# Patient Record
Sex: Female | Born: 1960 | Race: White | Hispanic: No | Marital: Married | Smoking: Current every day smoker
Health system: Southern US, Community
[De-identification: ages and names within clinical notes are randomized; demographics above are authoritative.]

## PROBLEM LIST (undated history)

## (undated) DIAGNOSIS — M549 Dorsalgia, unspecified: Secondary | ICD-10-CM

## (undated) DIAGNOSIS — F32A Depression, unspecified: Secondary | ICD-10-CM

## (undated) DIAGNOSIS — R32 Unspecified urinary incontinence: Secondary | ICD-10-CM

## (undated) DIAGNOSIS — G43909 Migraine, unspecified, not intractable, without status migrainosus: Secondary | ICD-10-CM

## (undated) DIAGNOSIS — F329 Major depressive disorder, single episode, unspecified: Secondary | ICD-10-CM

## (undated) DIAGNOSIS — F419 Anxiety disorder, unspecified: Secondary | ICD-10-CM

## (undated) HISTORY — DX: Unspecified urinary incontinence: R32

## (undated) HISTORY — PX: BACK SURGERY: SHX140

## (undated) HISTORY — DX: Dorsalgia, unspecified: M54.9

## (undated) HISTORY — DX: Anxiety disorder, unspecified: F41.9

## (undated) HISTORY — DX: Migraine, unspecified, not intractable, without status migrainosus: G43.909

## (undated) HISTORY — DX: Major depressive disorder, single episode, unspecified: F32.9

## (undated) HISTORY — DX: Depression, unspecified: F32.A

---

## 2003-01-21 ENCOUNTER — Ambulatory Visit (HOSPITAL_COMMUNITY): Admission: RE | Admit: 2003-01-21 | Discharge: 2003-01-21 | Payer: Self-pay | Admitting: Internal Medicine

## 2009-07-10 ENCOUNTER — Ambulatory Visit (HOSPITAL_COMMUNITY): Admission: RE | Admit: 2009-07-10 | Discharge: 2009-07-10 | Payer: Self-pay | Admitting: Neurological Surgery

## 2010-07-13 LAB — CBC
Platelets: 294 10*3/uL (ref 150–400)
RBC: 3.95 MIL/uL (ref 3.87–5.11)
WBC: 6.9 10*3/uL (ref 4.0–10.5)

## 2010-07-13 LAB — SURGICAL PCR SCREEN: MRSA, PCR: NEGATIVE

## 2010-09-04 NOTE — Op Note (Signed)
[name], Kristy Pearson                         ACCOUNT NO.:  [id number]   MEDICAL RECORD NO.:  [id number]                   PATIENT TYPE:  AMB   LOCATION:  DAY                                  FACILITY:  [facility]   PHYSICIAN:  Lionel December, M.D.                 DATE OF BIRTH:  21-Aug-1960   DATE OF PROCEDURE:  01/21/2003  DATE OF DISCHARGE:                                 OPERATIVE REPORT   PROCEDURE:  Total colonoscopy.   ENDOSCOPIST:  Lionel December, M.D.   INDICATIONS:  [name] is a 50 year old Caucasian female who recently had an  episode of rectal bleeding when she passed a moderate amount of blood.  She  has had a few more episodes of hematochezia and she also has intermittent,  left upper quadrant pain.  She is undergoing diagnostic colonoscopy.  The procedure and risks were reviewed with the patient and informed consent  was obtained.   PREOPERATIVE MEDICATIONS:  Demerol 25 mg IV and Versed 10 mg IV in divided  dose.   FINDINGS:  The procedure performed in endoscopy suite.  The patient's vital  signs and O2 saturation were monitored during the procedure and remained  stable.  The patient was placed in the left lateral recumbent position and  rectal examination was performed.  No abnormality noted on external or  digital exam.   Olympus videoscope was placed in the rectum and advanced under vision into  the sigmoid colon.  Preparation was satisfactory.  There was a tiny polyp at  the midsigmoid colon which was ablated by a cold biopsy.  The scope was  passed into the cecum which was identified by appendiceal orifice and  ileocecal valve.  Pictures were taken for the record.  A short segment of TL  was also examined and was normal.  The endoscope was gradually withdrawn and  the colonic mucosa was once again carefully examined.  There were no other  mucosal abnormalities.  The rectal mucosa was normal.   The scope was retroflexed to examine the anorectal junction; and  moderate  sized hemorrhoids were noted below the dentate line. The endoscope was  straightened and withdrawn.  The patient tolerated the procedure well.   FINAL DIAGNOSES:  1. Small polyp ablated by a cold biopsy from the sigmoid colon.  2. Moderate-sized external hemorrhoids felt to be the source of her rectal     bleeding.  3. Colonoscopy and terminal ileoscopy was otherwise normal.   RECOMMENDATIONS:  1. TSH will be checked today.  2.     Citrucel 1 tablespoonful daily along with high fiber diet and Levsin SL     t.i.d. p.r.n. for left-sided abdominal pain.  3. She will go ahead and use ProctoFoam HC per rectum b.i.d. for 10-14 days.  4. I will contact the patient with results of biopsy and blood tests.      ___________________________________________  Lionel December, M.D.   NR/MEDQ  D:  01/21/2003  T:  01/21/2003  Job:  [id number]   cc:   Fara Chute  [address] [place]  [place] [address]  Fax: [phone]

## 2011-04-26 DIAGNOSIS — F411 Generalized anxiety disorder: Secondary | ICD-10-CM | POA: Diagnosis not present

## 2011-04-26 DIAGNOSIS — IMO0002 Reserved for concepts with insufficient information to code with codable children: Secondary | ICD-10-CM | POA: Diagnosis not present

## 2011-04-26 DIAGNOSIS — F17200 Nicotine dependence, unspecified, uncomplicated: Secondary | ICD-10-CM | POA: Diagnosis not present

## 2011-04-26 DIAGNOSIS — G5600 Carpal tunnel syndrome, unspecified upper limb: Secondary | ICD-10-CM | POA: Diagnosis not present

## 2011-04-26 DIAGNOSIS — M545 Low back pain: Secondary | ICD-10-CM | POA: Diagnosis not present

## 2011-07-12 DIAGNOSIS — M545 Low back pain: Secondary | ICD-10-CM | POA: Diagnosis not present

## 2011-07-12 DIAGNOSIS — F17200 Nicotine dependence, unspecified, uncomplicated: Secondary | ICD-10-CM | POA: Diagnosis not present

## 2011-07-12 DIAGNOSIS — G5600 Carpal tunnel syndrome, unspecified upper limb: Secondary | ICD-10-CM | POA: Diagnosis not present

## 2011-07-12 DIAGNOSIS — IMO0002 Reserved for concepts with insufficient information to code with codable children: Secondary | ICD-10-CM | POA: Diagnosis not present

## 2011-07-12 DIAGNOSIS — J309 Allergic rhinitis, unspecified: Secondary | ICD-10-CM | POA: Diagnosis not present

## 2011-07-12 DIAGNOSIS — F411 Generalized anxiety disorder: Secondary | ICD-10-CM | POA: Diagnosis not present

## 2011-10-23 DIAGNOSIS — R110 Nausea: Secondary | ICD-10-CM | POA: Diagnosis not present

## 2011-10-23 DIAGNOSIS — N859 Noninflammatory disorder of uterus, unspecified: Secondary | ICD-10-CM | POA: Diagnosis not present

## 2011-10-23 DIAGNOSIS — K2900 Acute gastritis without bleeding: Secondary | ICD-10-CM | POA: Diagnosis not present

## 2011-10-23 DIAGNOSIS — R1110 Vomiting, unspecified: Secondary | ICD-10-CM | POA: Diagnosis not present

## 2011-10-23 DIAGNOSIS — R109 Unspecified abdominal pain: Secondary | ICD-10-CM | POA: Diagnosis not present

## 2011-10-23 DIAGNOSIS — K2990 Gastroduodenitis, unspecified, without bleeding: Secondary | ICD-10-CM | POA: Diagnosis not present

## 2011-10-23 DIAGNOSIS — N200 Calculus of kidney: Secondary | ICD-10-CM | POA: Diagnosis not present

## 2011-10-23 DIAGNOSIS — F17200 Nicotine dependence, unspecified, uncomplicated: Secondary | ICD-10-CM | POA: Diagnosis not present

## 2011-10-23 DIAGNOSIS — Z79899 Other long term (current) drug therapy: Secondary | ICD-10-CM | POA: Diagnosis not present

## 2011-10-23 DIAGNOSIS — K2970 Gastritis, unspecified, without bleeding: Secondary | ICD-10-CM | POA: Diagnosis not present

## 2011-10-23 DIAGNOSIS — R112 Nausea with vomiting, unspecified: Secondary | ICD-10-CM | POA: Diagnosis not present

## 2011-10-26 DIAGNOSIS — R112 Nausea with vomiting, unspecified: Secondary | ICD-10-CM | POA: Diagnosis not present

## 2011-10-29 DIAGNOSIS — R1011 Right upper quadrant pain: Secondary | ICD-10-CM | POA: Diagnosis not present

## 2011-10-29 DIAGNOSIS — R109 Unspecified abdominal pain: Secondary | ICD-10-CM | POA: Diagnosis not present

## 2011-11-11 DIAGNOSIS — Z124 Encounter for screening for malignant neoplasm of cervix: Secondary | ICD-10-CM | POA: Diagnosis not present

## 2011-11-11 DIAGNOSIS — K625 Hemorrhage of anus and rectum: Secondary | ICD-10-CM | POA: Diagnosis not present

## 2011-11-11 DIAGNOSIS — F17200 Nicotine dependence, unspecified, uncomplicated: Secondary | ICD-10-CM | POA: Diagnosis not present

## 2011-11-11 DIAGNOSIS — J309 Allergic rhinitis, unspecified: Secondary | ICD-10-CM | POA: Diagnosis not present

## 2011-11-11 DIAGNOSIS — R112 Nausea with vomiting, unspecified: Secondary | ICD-10-CM | POA: Diagnosis not present

## 2011-11-11 DIAGNOSIS — Z01419 Encounter for gynecological examination (general) (routine) without abnormal findings: Secondary | ICD-10-CM | POA: Diagnosis not present

## 2011-11-11 DIAGNOSIS — Z0000 Encounter for general adult medical examination without abnormal findings: Secondary | ICD-10-CM | POA: Diagnosis not present

## 2011-11-11 DIAGNOSIS — M545 Low back pain: Secondary | ICD-10-CM | POA: Diagnosis not present

## 2011-11-11 DIAGNOSIS — F411 Generalized anxiety disorder: Secondary | ICD-10-CM | POA: Diagnosis not present

## 2011-12-17 IMAGING — RF DG LUMBAR SPINE 2-3V
1 series · 2 of 2 positions shown · non-contrast
Comparison: None.

CLINICAL DATA: Lumbar radiculopathy and disc herniation.

LUMBAR SPINE - 2-3 VIEW

[Series 1: run · 2 of 2 slices shown]
[im 1/2]
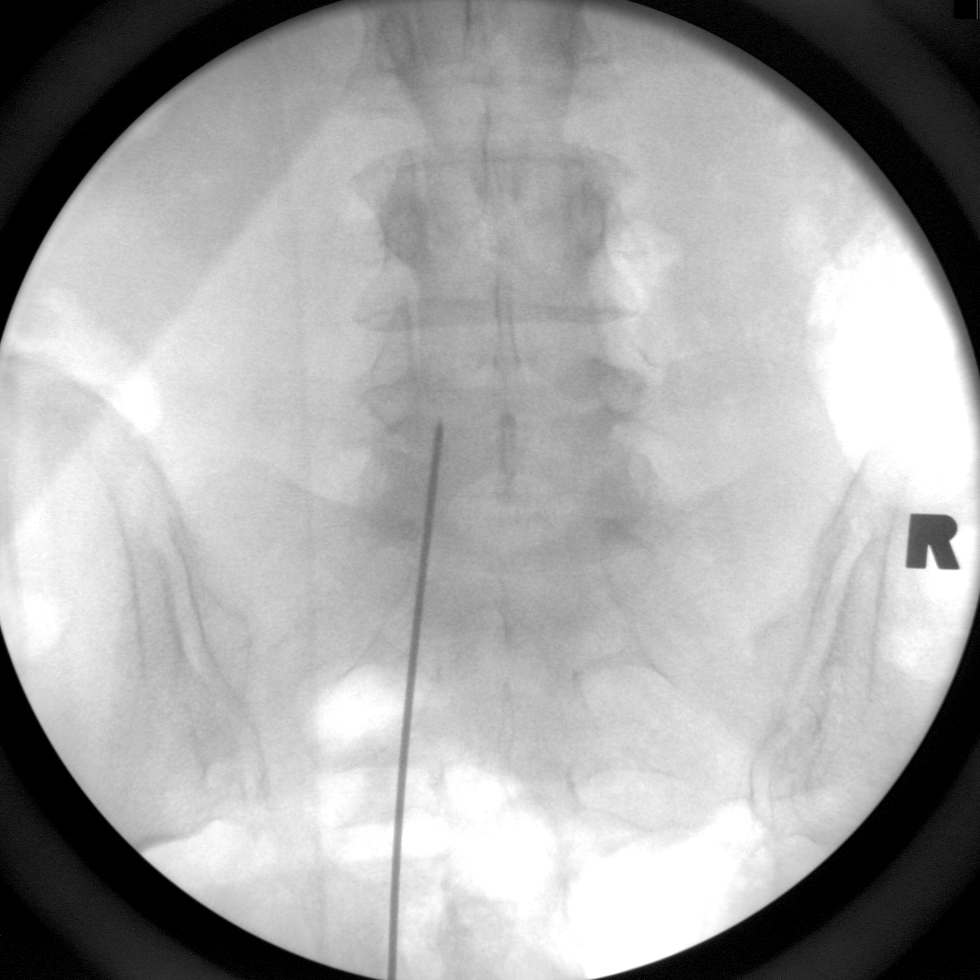
[im 2/2]
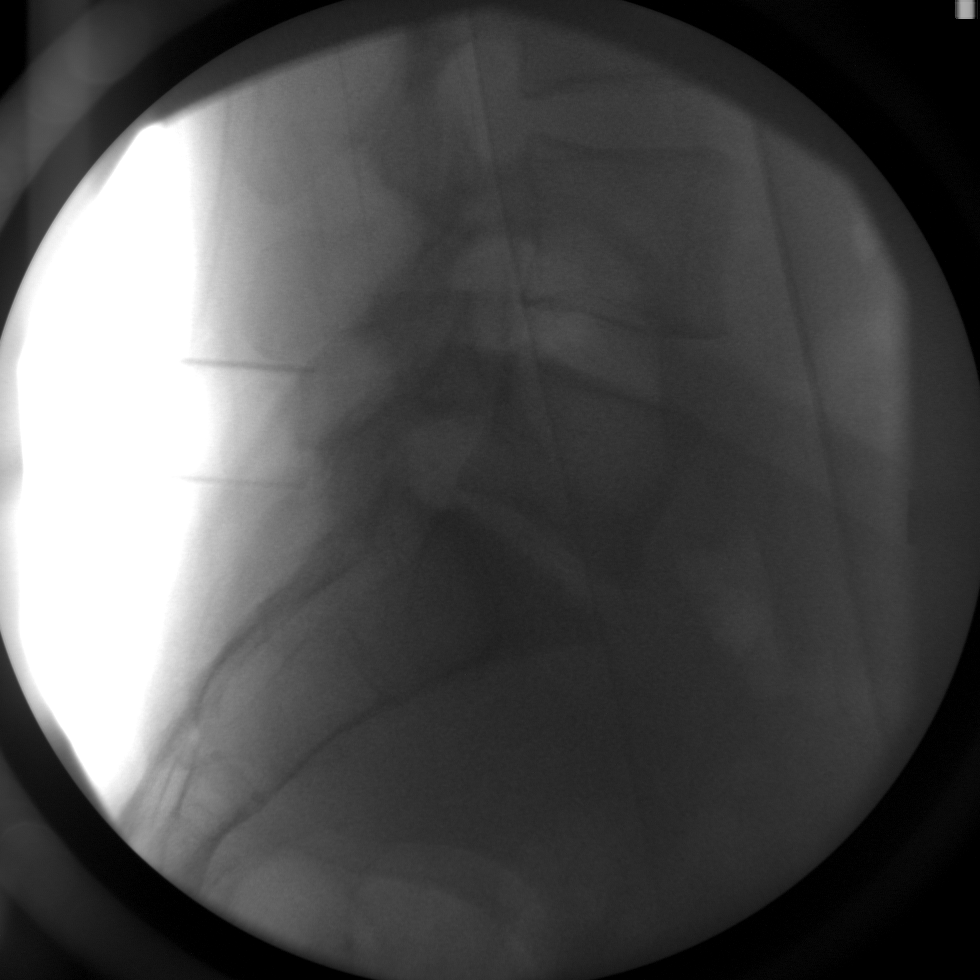

[2 of 2 positions shown; findings below may reference images not displayed]

FINDINGS: Initial intraoperative PA view shows a needle overlying
the lower lumbar spine at the level of the L5 vertebra.

The cross-table lateral views and swelling obtained shows
instruments posteriorly within the interspinous spaces of L4-5 and
L5-S1.
IMPRESSION: Intraoperative localization of interspinous spaces at L4-5 and L5-
S1.

## 2012-02-02 DIAGNOSIS — F411 Generalized anxiety disorder: Secondary | ICD-10-CM | POA: Diagnosis not present

## 2012-02-02 DIAGNOSIS — IMO0002 Reserved for concepts with insufficient information to code with codable children: Secondary | ICD-10-CM | POA: Diagnosis not present

## 2012-02-02 DIAGNOSIS — F17200 Nicotine dependence, unspecified, uncomplicated: Secondary | ICD-10-CM | POA: Diagnosis not present

## 2012-02-02 DIAGNOSIS — M545 Low back pain: Secondary | ICD-10-CM | POA: Diagnosis not present

## 2012-02-02 DIAGNOSIS — Z23 Encounter for immunization: Secondary | ICD-10-CM | POA: Diagnosis not present

## 2012-06-05 DIAGNOSIS — F411 Generalized anxiety disorder: Secondary | ICD-10-CM | POA: Diagnosis not present

## 2012-06-05 DIAGNOSIS — M545 Low back pain: Secondary | ICD-10-CM | POA: Diagnosis not present

## 2012-06-05 DIAGNOSIS — J069 Acute upper respiratory infection, unspecified: Secondary | ICD-10-CM | POA: Diagnosis not present

## 2012-06-05 DIAGNOSIS — F17200 Nicotine dependence, unspecified, uncomplicated: Secondary | ICD-10-CM | POA: Diagnosis not present

## 2012-06-05 DIAGNOSIS — R51 Headache: Secondary | ICD-10-CM | POA: Diagnosis not present

## 2012-10-10 DIAGNOSIS — F17200 Nicotine dependence, unspecified, uncomplicated: Secondary | ICD-10-CM | POA: Diagnosis not present

## 2012-10-10 DIAGNOSIS — R51 Headache: Secondary | ICD-10-CM | POA: Diagnosis not present

## 2012-10-10 DIAGNOSIS — F411 Generalized anxiety disorder: Secondary | ICD-10-CM | POA: Diagnosis not present

## 2012-10-10 DIAGNOSIS — K3189 Other diseases of stomach and duodenum: Secondary | ICD-10-CM | POA: Diagnosis not present

## 2012-10-10 DIAGNOSIS — R1013 Epigastric pain: Secondary | ICD-10-CM | POA: Diagnosis not present

## 2012-10-10 DIAGNOSIS — M545 Low back pain: Secondary | ICD-10-CM | POA: Diagnosis not present

## 2013-02-12 DIAGNOSIS — R51 Headache: Secondary | ICD-10-CM | POA: Diagnosis not present

## 2013-02-12 DIAGNOSIS — F17200 Nicotine dependence, unspecified, uncomplicated: Secondary | ICD-10-CM | POA: Diagnosis not present

## 2013-02-12 DIAGNOSIS — Z23 Encounter for immunization: Secondary | ICD-10-CM | POA: Diagnosis not present

## 2013-02-12 DIAGNOSIS — G619 Inflammatory polyneuropathy, unspecified: Secondary | ICD-10-CM | POA: Diagnosis not present

## 2013-02-12 DIAGNOSIS — F411 Generalized anxiety disorder: Secondary | ICD-10-CM | POA: Diagnosis not present

## 2013-02-12 DIAGNOSIS — M545 Low back pain: Secondary | ICD-10-CM | POA: Diagnosis not present

## 2013-05-15 DIAGNOSIS — F411 Generalized anxiety disorder: Secondary | ICD-10-CM | POA: Diagnosis not present

## 2013-05-15 DIAGNOSIS — M545 Low back pain, unspecified: Secondary | ICD-10-CM | POA: Diagnosis not present

## 2013-05-15 DIAGNOSIS — K2100 Gastro-esophageal reflux disease with esophagitis, without bleeding: Secondary | ICD-10-CM | POA: Diagnosis not present

## 2013-05-15 DIAGNOSIS — F17200 Nicotine dependence, unspecified, uncomplicated: Secondary | ICD-10-CM | POA: Diagnosis not present

## 2013-05-15 DIAGNOSIS — R319 Hematuria, unspecified: Secondary | ICD-10-CM | POA: Diagnosis not present

## 2013-05-15 DIAGNOSIS — R51 Headache: Secondary | ICD-10-CM | POA: Diagnosis not present

## 2013-05-17 DIAGNOSIS — R310 Gross hematuria: Secondary | ICD-10-CM | POA: Diagnosis not present

## 2013-05-17 DIAGNOSIS — N200 Calculus of kidney: Secondary | ICD-10-CM | POA: Diagnosis not present

## 2013-09-14 DIAGNOSIS — G622 Polyneuropathy due to other toxic agents: Secondary | ICD-10-CM | POA: Diagnosis not present

## 2013-09-14 DIAGNOSIS — K2100 Gastro-esophageal reflux disease with esophagitis, without bleeding: Secondary | ICD-10-CM | POA: Diagnosis not present

## 2013-09-14 DIAGNOSIS — R51 Headache: Secondary | ICD-10-CM | POA: Diagnosis not present

## 2013-09-14 DIAGNOSIS — F411 Generalized anxiety disorder: Secondary | ICD-10-CM | POA: Diagnosis not present

## 2013-09-14 DIAGNOSIS — G619 Inflammatory polyneuropathy, unspecified: Secondary | ICD-10-CM | POA: Diagnosis not present

## 2013-09-14 DIAGNOSIS — F17200 Nicotine dependence, unspecified, uncomplicated: Secondary | ICD-10-CM | POA: Diagnosis not present

## 2013-09-14 DIAGNOSIS — M545 Low back pain, unspecified: Secondary | ICD-10-CM | POA: Diagnosis not present

## 2013-11-06 DIAGNOSIS — R310 Gross hematuria: Secondary | ICD-10-CM | POA: Diagnosis not present

## 2013-11-06 DIAGNOSIS — F17200 Nicotine dependence, unspecified, uncomplicated: Secondary | ICD-10-CM | POA: Diagnosis not present

## 2013-11-06 DIAGNOSIS — M545 Low back pain, unspecified: Secondary | ICD-10-CM | POA: Diagnosis not present

## 2013-11-06 DIAGNOSIS — G619 Inflammatory polyneuropathy, unspecified: Secondary | ICD-10-CM | POA: Diagnosis not present

## 2013-11-06 DIAGNOSIS — M5430 Sciatica, unspecified side: Secondary | ICD-10-CM | POA: Diagnosis not present

## 2013-11-06 DIAGNOSIS — N200 Calculus of kidney: Secondary | ICD-10-CM | POA: Diagnosis not present

## 2013-11-09 DIAGNOSIS — M47817 Spondylosis without myelopathy or radiculopathy, lumbosacral region: Secondary | ICD-10-CM | POA: Diagnosis not present

## 2013-11-09 DIAGNOSIS — M5137 Other intervertebral disc degeneration, lumbosacral region: Secondary | ICD-10-CM | POA: Diagnosis not present

## 2013-11-09 DIAGNOSIS — N200 Calculus of kidney: Secondary | ICD-10-CM | POA: Diagnosis not present

## 2014-01-15 DIAGNOSIS — M545 Low back pain, unspecified: Secondary | ICD-10-CM | POA: Diagnosis not present

## 2014-01-15 DIAGNOSIS — G622 Polyneuropathy due to other toxic agents: Secondary | ICD-10-CM | POA: Diagnosis not present

## 2014-01-15 DIAGNOSIS — F411 Generalized anxiety disorder: Secondary | ICD-10-CM | POA: Diagnosis not present

## 2014-01-15 DIAGNOSIS — R51 Headache: Secondary | ICD-10-CM | POA: Diagnosis not present

## 2014-01-15 DIAGNOSIS — Z23 Encounter for immunization: Secondary | ICD-10-CM | POA: Diagnosis not present

## 2014-01-15 DIAGNOSIS — F17200 Nicotine dependence, unspecified, uncomplicated: Secondary | ICD-10-CM | POA: Diagnosis not present

## 2014-01-15 DIAGNOSIS — G619 Inflammatory polyneuropathy, unspecified: Secondary | ICD-10-CM | POA: Diagnosis not present

## 2014-01-15 DIAGNOSIS — K2100 Gastro-esophageal reflux disease with esophagitis, without bleeding: Secondary | ICD-10-CM | POA: Diagnosis not present

## 2014-08-09 DIAGNOSIS — Z1389 Encounter for screening for other disorder: Secondary | ICD-10-CM | POA: Diagnosis not present

## 2014-08-09 DIAGNOSIS — M545 Low back pain: Secondary | ICD-10-CM | POA: Diagnosis not present

## 2014-08-09 DIAGNOSIS — K210 Gastro-esophageal reflux disease with esophagitis: Secondary | ICD-10-CM | POA: Diagnosis not present

## 2014-08-09 DIAGNOSIS — R51 Headache: Secondary | ICD-10-CM | POA: Diagnosis not present

## 2014-08-09 DIAGNOSIS — F0280 Dementia in other diseases classified elsewhere without behavioral disturbance: Secondary | ICD-10-CM | POA: Diagnosis not present

## 2014-08-09 DIAGNOSIS — G619 Inflammatory polyneuropathy, unspecified: Secondary | ICD-10-CM | POA: Diagnosis not present

## 2014-08-09 DIAGNOSIS — F324 Major depressive disorder, single episode, in partial remission: Secondary | ICD-10-CM | POA: Diagnosis not present

## 2014-08-09 DIAGNOSIS — Z720 Tobacco use: Secondary | ICD-10-CM | POA: Diagnosis not present

## 2014-08-09 DIAGNOSIS — F411 Generalized anxiety disorder: Secondary | ICD-10-CM | POA: Diagnosis not present

## 2014-12-09 DIAGNOSIS — K210 Gastro-esophageal reflux disease with esophagitis: Secondary | ICD-10-CM | POA: Diagnosis not present

## 2014-12-09 DIAGNOSIS — F411 Generalized anxiety disorder: Secondary | ICD-10-CM | POA: Diagnosis not present

## 2014-12-09 DIAGNOSIS — R51 Headache: Secondary | ICD-10-CM | POA: Diagnosis not present

## 2014-12-09 DIAGNOSIS — F324 Major depressive disorder, single episode, in partial remission: Secondary | ICD-10-CM | POA: Diagnosis not present

## 2014-12-09 DIAGNOSIS — M545 Low back pain: Secondary | ICD-10-CM | POA: Diagnosis not present

## 2014-12-09 DIAGNOSIS — F0280 Dementia in other diseases classified elsewhere without behavioral disturbance: Secondary | ICD-10-CM | POA: Diagnosis not present

## 2014-12-09 DIAGNOSIS — G619 Inflammatory polyneuropathy, unspecified: Secondary | ICD-10-CM | POA: Diagnosis not present

## 2014-12-09 DIAGNOSIS — Z720 Tobacco use: Secondary | ICD-10-CM | POA: Diagnosis not present

## 2014-12-17 ENCOUNTER — Ambulatory Visit: Payer: Medicare Other | Admitting: Neurology

## 2014-12-17 ENCOUNTER — Telehealth: Payer: Self-pay | Admitting: *Deleted

## 2014-12-17 NOTE — Telephone Encounter (Signed)
no showed new patient appt

## 2014-12-18 ENCOUNTER — Encounter: Payer: Self-pay | Admitting: Neurology

## 2015-01-06 ENCOUNTER — Ambulatory Visit: Payer: Medicare Other | Admitting: Neurology

## 2015-03-04 DIAGNOSIS — M545 Low back pain: Secondary | ICD-10-CM | POA: Diagnosis not present

## 2015-03-04 DIAGNOSIS — R51 Headache: Secondary | ICD-10-CM | POA: Diagnosis not present

## 2015-03-04 DIAGNOSIS — F411 Generalized anxiety disorder: Secondary | ICD-10-CM | POA: Diagnosis not present

## 2015-03-04 DIAGNOSIS — K210 Gastro-esophageal reflux disease with esophagitis: Secondary | ICD-10-CM | POA: Diagnosis not present

## 2015-03-04 DIAGNOSIS — G619 Inflammatory polyneuropathy, unspecified: Secondary | ICD-10-CM | POA: Diagnosis not present

## 2015-03-04 DIAGNOSIS — Z720 Tobacco use: Secondary | ICD-10-CM | POA: Diagnosis not present

## 2015-03-04 DIAGNOSIS — F0280 Dementia in other diseases classified elsewhere without behavioral disturbance: Secondary | ICD-10-CM | POA: Diagnosis not present

## 2015-03-04 DIAGNOSIS — F324 Major depressive disorder, single episode, in partial remission: Secondary | ICD-10-CM | POA: Diagnosis not present

## 2015-03-04 DIAGNOSIS — Z23 Encounter for immunization: Secondary | ICD-10-CM | POA: Diagnosis not present

## 2015-09-02 DIAGNOSIS — R1013 Epigastric pain: Secondary | ICD-10-CM | POA: Diagnosis not present

## 2015-09-02 DIAGNOSIS — N390 Urinary tract infection, site not specified: Secondary | ICD-10-CM | POA: Diagnosis not present

## 2015-09-02 DIAGNOSIS — F329 Major depressive disorder, single episode, unspecified: Secondary | ICD-10-CM | POA: Diagnosis not present

## 2015-09-02 DIAGNOSIS — R1110 Vomiting, unspecified: Secondary | ICD-10-CM | POA: Diagnosis not present

## 2015-09-02 DIAGNOSIS — Z87442 Personal history of urinary calculi: Secondary | ICD-10-CM | POA: Diagnosis not present

## 2015-09-02 DIAGNOSIS — F17200 Nicotine dependence, unspecified, uncomplicated: Secondary | ICD-10-CM | POA: Diagnosis not present

## 2015-09-02 DIAGNOSIS — Z7982 Long term (current) use of aspirin: Secondary | ICD-10-CM | POA: Diagnosis not present

## 2015-09-02 DIAGNOSIS — Z79899 Other long term (current) drug therapy: Secondary | ICD-10-CM | POA: Diagnosis not present

## 2015-09-02 DIAGNOSIS — R319 Hematuria, unspecified: Secondary | ICD-10-CM | POA: Diagnosis not present

## 2015-09-10 DIAGNOSIS — B029 Zoster without complications: Secondary | ICD-10-CM | POA: Diagnosis not present

## 2015-09-22 DIAGNOSIS — B029 Zoster without complications: Secondary | ICD-10-CM | POA: Diagnosis not present

## 2016-01-26 DIAGNOSIS — K210 Gastro-esophageal reflux disease with esophagitis: Secondary | ICD-10-CM | POA: Diagnosis not present

## 2016-01-26 DIAGNOSIS — Z79891 Long term (current) use of opiate analgesic: Secondary | ICD-10-CM | POA: Diagnosis not present

## 2016-01-26 DIAGNOSIS — F411 Generalized anxiety disorder: Secondary | ICD-10-CM | POA: Diagnosis not present

## 2016-01-26 DIAGNOSIS — B029 Zoster without complications: Secondary | ICD-10-CM | POA: Diagnosis not present

## 2016-01-26 DIAGNOSIS — Z6820 Body mass index (BMI) 20.0-20.9, adult: Secondary | ICD-10-CM | POA: Diagnosis not present

## 2016-01-26 DIAGNOSIS — F324 Major depressive disorder, single episode, in partial remission: Secondary | ICD-10-CM | POA: Diagnosis not present

## 2016-01-26 DIAGNOSIS — R51 Headache: Secondary | ICD-10-CM | POA: Diagnosis not present

## 2016-01-26 DIAGNOSIS — Z720 Tobacco use: Secondary | ICD-10-CM | POA: Diagnosis not present

## 2016-01-26 DIAGNOSIS — G619 Inflammatory polyneuropathy, unspecified: Secondary | ICD-10-CM | POA: Diagnosis not present

## 2016-01-26 DIAGNOSIS — M545 Low back pain: Secondary | ICD-10-CM | POA: Diagnosis not present

## 2016-01-26 DIAGNOSIS — Z23 Encounter for immunization: Secondary | ICD-10-CM | POA: Diagnosis not present

## 2016-01-26 DIAGNOSIS — F0280 Dementia in other diseases classified elsewhere without behavioral disturbance: Secondary | ICD-10-CM | POA: Diagnosis not present

## 2016-04-20 DIAGNOSIS — R51 Headache: Secondary | ICD-10-CM | POA: Diagnosis not present

## 2016-04-20 DIAGNOSIS — F324 Major depressive disorder, single episode, in partial remission: Secondary | ICD-10-CM | POA: Diagnosis not present

## 2016-04-20 DIAGNOSIS — F411 Generalized anxiety disorder: Secondary | ICD-10-CM | POA: Diagnosis not present

## 2016-04-20 DIAGNOSIS — Z1389 Encounter for screening for other disorder: Secondary | ICD-10-CM | POA: Diagnosis not present

## 2016-04-20 DIAGNOSIS — Z79891 Long term (current) use of opiate analgesic: Secondary | ICD-10-CM | POA: Diagnosis not present

## 2016-04-20 DIAGNOSIS — K210 Gastro-esophageal reflux disease with esophagitis: Secondary | ICD-10-CM | POA: Diagnosis not present

## 2016-04-20 DIAGNOSIS — G619 Inflammatory polyneuropathy, unspecified: Secondary | ICD-10-CM | POA: Diagnosis not present

## 2016-04-20 DIAGNOSIS — M545 Low back pain: Secondary | ICD-10-CM | POA: Diagnosis not present

## 2016-05-20 ENCOUNTER — Ambulatory Visit: Payer: Medicare Other | Admitting: Neurology

## 2016-06-02 ENCOUNTER — Ambulatory Visit: Payer: Medicare Other | Admitting: Neurology

## 2016-07-20 DIAGNOSIS — F331 Major depressive disorder, recurrent, moderate: Secondary | ICD-10-CM | POA: Diagnosis not present

## 2016-07-20 DIAGNOSIS — M545 Low back pain: Secondary | ICD-10-CM | POA: Diagnosis not present

## 2016-07-20 DIAGNOSIS — F0280 Dementia in other diseases classified elsewhere without behavioral disturbance: Secondary | ICD-10-CM | POA: Diagnosis not present

## 2016-07-20 DIAGNOSIS — F411 Generalized anxiety disorder: Secondary | ICD-10-CM | POA: Diagnosis not present

## 2016-07-20 DIAGNOSIS — Z79891 Long term (current) use of opiate analgesic: Secondary | ICD-10-CM | POA: Diagnosis not present

## 2016-07-20 DIAGNOSIS — R51 Headache: Secondary | ICD-10-CM | POA: Diagnosis not present

## 2016-07-20 DIAGNOSIS — K210 Gastro-esophageal reflux disease with esophagitis: Secondary | ICD-10-CM | POA: Diagnosis not present

## 2016-07-20 DIAGNOSIS — G619 Inflammatory polyneuropathy, unspecified: Secondary | ICD-10-CM | POA: Diagnosis not present

## 2016-08-24 ENCOUNTER — Ambulatory Visit (INDEPENDENT_AMBULATORY_CARE_PROVIDER_SITE_OTHER): Payer: Medicare Other | Admitting: Neurology

## 2016-08-24 ENCOUNTER — Encounter (INDEPENDENT_AMBULATORY_CARE_PROVIDER_SITE_OTHER): Payer: Self-pay

## 2016-08-24 ENCOUNTER — Encounter: Payer: Self-pay | Admitting: Neurology

## 2016-08-24 VITALS — BP 127/80 | HR 71 | Ht 62.0 in | Wt 119.0 lb

## 2016-08-24 DIAGNOSIS — R29898 Other symptoms and signs involving the musculoskeletal system: Secondary | ICD-10-CM | POA: Diagnosis not present

## 2016-08-24 DIAGNOSIS — R51 Headache: Secondary | ICD-10-CM | POA: Diagnosis not present

## 2016-08-24 DIAGNOSIS — IMO0002 Reserved for concepts with insufficient information to code with codable children: Secondary | ICD-10-CM

## 2016-08-24 DIAGNOSIS — G43709 Chronic migraine without aura, not intractable, without status migrainosus: Secondary | ICD-10-CM | POA: Diagnosis not present

## 2016-08-24 DIAGNOSIS — G8929 Other chronic pain: Secondary | ICD-10-CM

## 2016-08-24 DIAGNOSIS — G4440 Drug-induced headache, not elsewhere classified, not intractable: Secondary | ICD-10-CM

## 2016-08-24 MED ORDER — DIVALPROEX SODIUM ER 250 MG PO TB24: 250.0000 mg | ORAL_TABLET | Freq: Every day | ORAL | 11 refills | Status: DC
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
## 2016-08-24 NOTE — Patient Instructions (Addendum)
Remember to drink plenty of fluid, eat healthy meals and do not skip any meals. Try to eat protein with a every meal and eat a healthy snack such as fruit or nuts in between meals. Try to keep a regular sleep-wake schedule and try to exercise daily, particularly in the form of walking, 20-30 minutes a day, if you can.   As far as your medications are concerned, I would like to suggest:Depakote at bedtime  As far as diagnostic testing: MRi brain  I would like to see you back in 3-4 months, sooner if we need to. Please call us with any interim questions, concerns, problems, updates or refill requests.   Our phone number is [phone]. We also have an after hours call service for urgent matters and there is a physician on-call for urgent questions. For any emergencies you know to call 911 or go to the nearest emergency room  Valproic Acid, Divalproex Sodium delayed or extended-release tablets What is this medicine? DIVALPROEX SODIUM (dye VAL pro ex SO dee um) is used to prevent seizures caused by some forms of epilepsy. It is also used to treat bipolar mania and to prevent migraine headaches. This medicine may be used for other purposes; ask your health care provider or pharmacist if you have questions. COMMON BRAND NAME(S): Depakote, Depakote ER What should I tell my health care provider before I take this medicine? They need to know if you have any of these conditions: -if you often drink alcohol -kidney disease -liver disease -low platelet counts -mitochondrial disease -suicidal thoughts, plans, or attempt; a previous suicide attempt by you or a family member -urea cycle disorder (UCD) -an unusual or allergic reaction to divalproex sodium, sodium valproate, valproic acid, other medicines, foods, dyes, or preservatives -pregnant or trying to get pregnant -breast-feeding How should I use this medicine? Take this medicine by mouth with a drink of water. Follow the directions on the  prescription label. Do not cut, crush or chew this medicine. You can take it with or without food. If it upsets your stomach, take it with food. Take your medicine at regular intervals. Do not take it more often than directed. Do not stop taking except on your doctor's advice. A special MedGuide will be given to you by the pharmacist with each prescription and refill. Be sure to read this information carefully each time. Talk to your pediatrician regarding the use of this medicine in children. While this drug may be prescribed for children as young as 10 years for selected conditions, precautions do apply. Overdosage: If you think you have taken too much of this medicine contact a poison control center or emergency room at once. NOTE: This medicine is only for you. Do not share this medicine with others. What if I miss a dose? If you miss a dose, take it as soon as you can. If it is almost time for your next dose, take only that dose. Do not take double or extra doses. What may interact with this medicine? Do not take this medicine with any of the following medications: -sodium phenylbutyrate This medicine may also interact with the following medications: -aspirin -certain antibiotics like ertapenem, imipenem, meropenem -certain medicines for depression, anxiety, or psychotic disturbances -certain medicines for seizures like carbamazepine, clonazepam, diazepam, ethosuximide, felbamate, lamotrigine, phenobarbital, phenytoin, primidone, rufinamide, topiramate -certain medicines that treat or prevent blood clots like warfarin -cholestyramine -female hormones, like estrogens and birth control pills, patches, or rings -propofol -rifampin -ritonavir -tolbutamide -zidovudine This list may  not describe all possible interactions. Give your health care provider a list of all the medicines, herbs, non-prescription drugs, or dietary supplements you use. Also tell them if you smoke, drink alcohol, or use  illegal drugs. Some items may interact with your medicine. What should I watch for while using this medicine? Tell your doctor or healthcare professional if your symptoms do not get better or they start to get worse. Wear a medical ID bracelet or chain, and carry a card that describes your disease and details of your medicine and dosage times. You may get drowsy, dizzy, or have blurred vision. Do not drive, use machinery, or do anything that needs mental alertness until you know how this medicine affects you. To reduce dizzy or fainting spells, do not sit or stand up quickly, especially if you are an older patient. Alcohol can increase drowsiness and dizziness. Avoid alcoholic drinks. This medicine can make you more sensitive to the sun. Keep out of the sun. If you cannot avoid being in the sun, wear protective clothing and use sunscreen. Do not use sun lamps or tanning beds/booths. Patients and their families should watch out for new or worsening depression or thoughts of suicide. Also watch out for sudden changes in feelings such as feeling anxious, agitated, panicky, irritable, hostile, aggressive, impulsive, severely restless, overly excited and hyperactive, or not being able to sleep. If this happens, especially at the beginning of treatment or after a change in dose, call your health care professional. Women should inform their doctor if they wish to become pregnant or think they might be pregnant. There is a potential for serious side effects to an unborn child. Talk to your health care professional or pharmacist for more information. Women who become pregnant while using this medicine may enroll in the [place] Pregnancy Registry by calling [phone]. This registry collects information about the safety of antiepileptic drug use during pregnancy. What side effects may I notice from receiving this medicine? Side effects that you should report to your doctor or health care  professional as soon as possible: -allergic reactions like skin rash, itching or hives, swelling of the face, lips, or tongue -changes in vision -redness, blistering, peeling or loosening of the skin, including inside the mouth -signs and symptoms of liver injury like dark yellow or brown urine; general ill feeling or flu-like symptoms; light-colored stools; loss of appetite; nausea; right upper belly pain; unusually weak or tired; yellowing of the eyes or skin -suicidal thoughts or other mood changes -unusual bleeding or bruising Side effects that usually do not require medical attention (report to your doctor or health care professional if they continue or are bothersome): -constipation -diarrhea -dizziness -hair loss -headache -loss of appetite -weight gain This list may not describe all possible side effects. Call your doctor for medical advice about side effects. You may report side effects to FDA at 1-800-FDA-1088. Where should I keep my medicine? Keep out of reach of children. Store at room temperature between 15 and 30 degrees C (59 and 86 degrees F). Keep container tightly closed. Throw away any unused medicine after the expiration date. NOTE: This sheet is a summary. It may not cover all possible information. If you have questions about this medicine, talk to your doctor, pharmacist, or health care provider.  2018 Elsevier/Gold Standard (2015-07-10 07:11:40)    Migraine Headache A migraine headache is an intense, throbbing pain on one side or both sides of the head. Migraines may also cause other symptoms,  such as nausea, vomiting, and sensitivity to light and noise. What are the causes? Doing or taking certain things may also trigger migraines, such as:  Alcohol.  Smoking.  Medicines, such as:  Medicine used to treat chest pain (nitroglycerine).  Birth control pills.  Estrogen pills.  Certain blood pressure medicines.  Aged cheeses, chocolate, or  caffeine.  Foods or drinks that contain nitrates, glutamate, aspartame, or tyramine.  Physical activity. Other things that may trigger a migraine include:  Menstruation.  Pregnancy.  Hunger.  Stress, lack of sleep, too much sleep, or fatigue.  Weather changes. What increases the risk? The following factors may make you more likely to experience migraine headaches:  Age. Risk increases with age.  Family history of migraine headaches.  Being Caucasian.  Depression and anxiety.  Obesity.  Being a woman.  Having a hole in the heart (patent foramen ovale) or other heart problems. What are the signs or symptoms? The main symptom of this condition is pulsating or throbbing pain. Pain may:  Happen in any area of the head, such as on one side or both sides.  Interfere with daily activities.  Get worse with physical activity.  Get worse with exposure to bright lights or loud noises. Other symptoms may include:  Nausea.  Vomiting.  Dizziness.  General sensitivity to bright lights, loud noises, or smells. Before you get a migraine, you may get warning signs that a migraine is developing (aura). An aura may include:  Seeing flashing lights or having blind spots.  Seeing bright spots, halos, or zigzag lines.  Having tunnel vision or blurred vision.  Having numbness or a tingling feeling.  Having trouble talking.  Having muscle weakness. How is this diagnosed? A migraine headache can be diagnosed based on:  Your symptoms.  A physical exam.  Tests, such as CT scan or MRI of the head. These imaging tests can help rule out other causes of headaches.  Taking fluid from the spine (lumbar puncture) and analyzing it (cerebrospinal fluid analysis, or CSF analysis). How is this treated? A migraine headache is usually treated with medicines that:  Relieve pain.  Relieve nausea.  Prevent migraines from coming back. Treatment may also  include:  Acupuncture.  Lifestyle changes like avoiding foods that trigger migraines. Follow these instructions at home: Medicines   Take over-the-counter and prescription medicines only as told by your health care provider.  Do not drive or use heavy machinery while taking prescription pain medicine.  To prevent or treat constipation while you are taking prescription pain medicine, your health care provider may recommend that you:  Drink enough fluid to keep your urine clear or pale yellow.  Take over-the-counter or prescription medicines.  Eat foods that are high in fiber, such as fresh fruits and vegetables, whole grains, and beans.  Limit foods that are high in fat and processed sugars, such as fried and sweet foods. Lifestyle   Avoid alcohol use.  Do not use any products that contain nicotine or tobacco, such as cigarettes and e-cigarettes. If you need help quitting, ask your health care provider.  Get at least 8 hours of sleep every night.  Limit your stress. General instructions    Keep a journal to find out what may trigger your migraine headaches. For example, write down:  What you eat and drink.  How much sleep you get.  Any change to your diet or medicines.  If you have a migraine:  Avoid things that make your symptoms worse, such  as bright lights.  It may help to lie down in a dark, quiet room.  Do not drive or use heavy machinery.  Ask your health care provider what activities are safe for you while you are experiencing symptoms.  Keep all follow-up visits as told by your health care provider. This is important. Contact a health care provider if:  You develop symptoms that are different or more severe than your usual migraine symptoms. Get help right away if:  Your migraine becomes severe.  You have a fever.  You have a stiff neck.  You have vision loss.  Your muscles feel weak or like you cannot control them.  You start to lose your  balance often.  You develop trouble walking.  You faint. This information is not intended to replace advice given to you by your health care provider. Make sure you discuss any questions you have with your health care provider. Document Released: 04/05/2005 Document Revised: 10/24/2015 Document Reviewed: 09/22/2015 Elsevier Interactive Patient Education  2017 [employer].

## 2016-08-25 ENCOUNTER — Telehealth: Payer: Self-pay

## 2016-08-25 LAB — COMPREHENSIVE METABOLIC PANEL
A/G RATIO: 1.9 (ref 1.2–2.2)
ALT: 18 IU/L (ref 0–32)
AST: 23 IU/L (ref 0–40)
Albumin: 4.9 g/dL (ref 3.5–5.5)
Alkaline Phosphatase: 77 IU/L (ref 39–117)
BUN/Creatinine Ratio: 16 (ref 9–23)
BUN: 12 mg/dL (ref 6–24)
Bilirubin Total: 0.2 mg/dL (ref 0.0–1.2)
CALCIUM: 10.1 mg/dL (ref 8.7–10.2)
CHLORIDE: 100 mmol/L (ref 96–106)
CO2: 22 mmol/L (ref 18–29)
Creatinine, Ser: 0.75 mg/dL (ref 0.57–1.00)
GFR calc Af Amer: 103 mL/min/{1.73_m2} (ref >59)
GFR, EST NON AFRICAN AMERICAN: 89 mL/min/{1.73_m2} (ref >59)
GLUCOSE: 76 mg/dL (ref 65–99)
Globulin, Total: 2.6 g/dL (ref 1.5–4.5)
POTASSIUM: 4.1 mmol/L (ref 3.5–5.2)
Sodium: 141 mmol/L (ref 134–144)
Total Protein: 7.5 g/dL (ref 6.0–8.5)

## 2016-08-25 LAB — CBC
HEMATOCRIT: 40.4 % (ref 34.0–46.6)
HEMOGLOBIN: 13.7 g/dL (ref 11.1–15.9)
MCH: 32 pg (ref 26.6–33.0)
MCHC: 33.9 g/dL (ref 31.5–35.7)
MCV: 94 fL (ref 79–97)
Platelets: 316 10*3/uL (ref 150–379)
RBC: 4.28 x10E6/uL (ref 3.77–5.28)
RDW: 13.7 % (ref 12.3–15.4)
WBC: 7.9 10*3/uL (ref 3.4–10.8)

## 2016-08-25 NOTE — Telephone Encounter (Signed)
-----   Message from Melvenia Beam, MD sent at 08/25/2016  2:41 PM EDT ----- Labs normal thanks

## 2016-08-25 NOTE — Telephone Encounter (Signed)
Called and spoke to pt's daughter Adonis Brook, on [place]) and gave normal lab results. Verbalized understanding and appreciation for call. Pt may call back w/ any questions.

## 2016-10-26 DIAGNOSIS — M545 Low back pain: Secondary | ICD-10-CM | POA: Diagnosis not present

## 2016-10-26 DIAGNOSIS — K210 Gastro-esophageal reflux disease with esophagitis: Secondary | ICD-10-CM | POA: Diagnosis not present

## 2016-10-26 DIAGNOSIS — Z79891 Long term (current) use of opiate analgesic: Secondary | ICD-10-CM | POA: Diagnosis not present

## 2016-10-26 DIAGNOSIS — Z0001 Encounter for general adult medical examination with abnormal findings: Secondary | ICD-10-CM | POA: Diagnosis not present

## 2016-10-26 DIAGNOSIS — F0280 Dementia in other diseases classified elsewhere without behavioral disturbance: Secondary | ICD-10-CM | POA: Diagnosis not present

## 2016-10-26 DIAGNOSIS — N951 Menopausal and female climacteric states: Secondary | ICD-10-CM | POA: Diagnosis not present

## 2016-10-26 DIAGNOSIS — G619 Inflammatory polyneuropathy, unspecified: Secondary | ICD-10-CM | POA: Diagnosis not present

## 2016-10-26 DIAGNOSIS — R51 Headache: Secondary | ICD-10-CM | POA: Diagnosis not present

## 2017-01-24 DIAGNOSIS — F0280 Dementia in other diseases classified elsewhere without behavioral disturbance: Secondary | ICD-10-CM | POA: Diagnosis not present

## 2017-01-24 DIAGNOSIS — G619 Inflammatory polyneuropathy, unspecified: Secondary | ICD-10-CM | POA: Diagnosis not present

## 2017-01-24 DIAGNOSIS — Z79891 Long term (current) use of opiate analgesic: Secondary | ICD-10-CM | POA: Diagnosis not present

## 2017-01-24 DIAGNOSIS — N951 Menopausal and female climacteric states: Secondary | ICD-10-CM | POA: Diagnosis not present

## 2017-01-24 DIAGNOSIS — K210 Gastro-esophageal reflux disease with esophagitis: Secondary | ICD-10-CM | POA: Diagnosis not present

## 2017-01-24 DIAGNOSIS — M545 Low back pain: Secondary | ICD-10-CM | POA: Diagnosis not present

## 2017-01-24 DIAGNOSIS — R51 Headache: Secondary | ICD-10-CM | POA: Diagnosis not present

## 2017-01-24 DIAGNOSIS — Z23 Encounter for immunization: Secondary | ICD-10-CM | POA: Diagnosis not present

## 2017-04-25 DIAGNOSIS — F0280 Dementia in other diseases classified elsewhere without behavioral disturbance: Secondary | ICD-10-CM | POA: Diagnosis not present

## 2017-04-25 DIAGNOSIS — F411 Generalized anxiety disorder: Secondary | ICD-10-CM | POA: Diagnosis not present

## 2017-04-25 DIAGNOSIS — Z720 Tobacco use: Secondary | ICD-10-CM | POA: Diagnosis not present

## 2017-04-25 DIAGNOSIS — R350 Frequency of micturition: Secondary | ICD-10-CM | POA: Diagnosis not present

## 2017-04-25 DIAGNOSIS — N951 Menopausal and female climacteric states: Secondary | ICD-10-CM | POA: Diagnosis not present

## 2017-04-25 DIAGNOSIS — Z1389 Encounter for screening for other disorder: Secondary | ICD-10-CM | POA: Diagnosis not present

## 2017-04-25 DIAGNOSIS — R51 Headache: Secondary | ICD-10-CM | POA: Diagnosis not present

## 2017-04-25 DIAGNOSIS — K210 Gastro-esophageal reflux disease with esophagitis: Secondary | ICD-10-CM | POA: Diagnosis not present

## 2017-04-25 DIAGNOSIS — G619 Inflammatory polyneuropathy, unspecified: Secondary | ICD-10-CM | POA: Diagnosis not present

## 2017-04-25 DIAGNOSIS — Z79891 Long term (current) use of opiate analgesic: Secondary | ICD-10-CM | POA: Diagnosis not present

## 2017-04-25 DIAGNOSIS — M545 Low back pain: Secondary | ICD-10-CM | POA: Diagnosis not present

## 2017-04-25 DIAGNOSIS — F331 Major depressive disorder, recurrent, moderate: Secondary | ICD-10-CM | POA: Diagnosis not present

## 2017-07-19 DIAGNOSIS — Z79891 Long term (current) use of opiate analgesic: Secondary | ICD-10-CM | POA: Diagnosis not present

## 2017-07-19 DIAGNOSIS — G619 Inflammatory polyneuropathy, unspecified: Secondary | ICD-10-CM | POA: Diagnosis not present

## 2017-07-19 DIAGNOSIS — R350 Frequency of micturition: Secondary | ICD-10-CM | POA: Diagnosis not present

## 2017-07-19 DIAGNOSIS — F0280 Dementia in other diseases classified elsewhere without behavioral disturbance: Secondary | ICD-10-CM | POA: Diagnosis not present

## 2017-07-19 DIAGNOSIS — M545 Low back pain: Secondary | ICD-10-CM | POA: Diagnosis not present

## 2017-07-19 DIAGNOSIS — N951 Menopausal and female climacteric states: Secondary | ICD-10-CM | POA: Diagnosis not present

## 2017-07-19 DIAGNOSIS — Z6822 Body mass index (BMI) 22.0-22.9, adult: Secondary | ICD-10-CM | POA: Diagnosis not present

## 2017-07-19 DIAGNOSIS — R51 Headache: Secondary | ICD-10-CM | POA: Diagnosis not present

## 2017-08-20 ENCOUNTER — Other Ambulatory Visit: Payer: Self-pay | Admitting: Neurology

## 2017-10-17 DIAGNOSIS — M545 Low back pain: Secondary | ICD-10-CM | POA: Diagnosis not present

## 2017-10-17 DIAGNOSIS — K210 Gastro-esophageal reflux disease with esophagitis: Secondary | ICD-10-CM | POA: Diagnosis not present

## 2017-10-17 DIAGNOSIS — Z720 Tobacco use: Secondary | ICD-10-CM | POA: Diagnosis not present

## 2017-10-17 DIAGNOSIS — Z79891 Long term (current) use of opiate analgesic: Secondary | ICD-10-CM | POA: Diagnosis not present

## 2017-10-17 DIAGNOSIS — F0280 Dementia in other diseases classified elsewhere without behavioral disturbance: Secondary | ICD-10-CM | POA: Diagnosis not present

## 2017-10-17 DIAGNOSIS — Z6822 Body mass index (BMI) 22.0-22.9, adult: Secondary | ICD-10-CM | POA: Diagnosis not present

## 2017-10-17 DIAGNOSIS — G619 Inflammatory polyneuropathy, unspecified: Secondary | ICD-10-CM | POA: Diagnosis not present

## 2017-10-17 DIAGNOSIS — R51 Headache: Secondary | ICD-10-CM | POA: Diagnosis not present

## 2017-11-18 ENCOUNTER — Other Ambulatory Visit: Payer: Self-pay | Admitting: Neurology

## 2018-01-11 DIAGNOSIS — Z79891 Long term (current) use of opiate analgesic: Secondary | ICD-10-CM | POA: Diagnosis not present

## 2018-01-11 DIAGNOSIS — N951 Menopausal and female climacteric states: Secondary | ICD-10-CM | POA: Diagnosis not present

## 2018-01-11 DIAGNOSIS — F331 Major depressive disorder, recurrent, moderate: Secondary | ICD-10-CM | POA: Diagnosis not present

## 2018-01-11 DIAGNOSIS — Z6821 Body mass index (BMI) 21.0-21.9, adult: Secondary | ICD-10-CM | POA: Diagnosis not present

## 2018-01-11 DIAGNOSIS — Z23 Encounter for immunization: Secondary | ICD-10-CM | POA: Diagnosis not present

## 2018-01-11 DIAGNOSIS — K210 Gastro-esophageal reflux disease with esophagitis: Secondary | ICD-10-CM | POA: Diagnosis not present

## 2018-01-11 DIAGNOSIS — M545 Low back pain: Secondary | ICD-10-CM | POA: Diagnosis not present

## 2018-01-11 DIAGNOSIS — F411 Generalized anxiety disorder: Secondary | ICD-10-CM | POA: Diagnosis not present

## 2018-01-11 DIAGNOSIS — Z720 Tobacco use: Secondary | ICD-10-CM | POA: Diagnosis not present

## 2018-01-11 DIAGNOSIS — R51 Headache: Secondary | ICD-10-CM | POA: Diagnosis not present

## 2018-01-11 DIAGNOSIS — F0280 Dementia in other diseases classified elsewhere without behavioral disturbance: Secondary | ICD-10-CM | POA: Diagnosis not present

## 2018-01-11 DIAGNOSIS — G619 Inflammatory polyneuropathy, unspecified: Secondary | ICD-10-CM | POA: Diagnosis not present

## 2018-01-14 ENCOUNTER — Other Ambulatory Visit: Payer: Self-pay | Admitting: Neurology

## 2018-01-23 DIAGNOSIS — E7800 Pure hypercholesterolemia, unspecified: Secondary | ICD-10-CM | POA: Diagnosis not present

## 2018-01-23 DIAGNOSIS — R5383 Other fatigue: Secondary | ICD-10-CM | POA: Diagnosis not present

## 2018-01-23 DIAGNOSIS — Z6821 Body mass index (BMI) 21.0-21.9, adult: Secondary | ICD-10-CM | POA: Diagnosis not present

## 2018-01-23 DIAGNOSIS — Z01419 Encounter for gynecological examination (general) (routine) without abnormal findings: Secondary | ICD-10-CM | POA: Diagnosis not present

## 2018-01-23 DIAGNOSIS — R51 Headache: Secondary | ICD-10-CM | POA: Diagnosis not present

## 2018-01-25 DIAGNOSIS — Z79891 Long term (current) use of opiate analgesic: Secondary | ICD-10-CM | POA: Diagnosis not present

## 2018-01-25 DIAGNOSIS — G619 Inflammatory polyneuropathy, unspecified: Secondary | ICD-10-CM | POA: Diagnosis not present

## 2018-01-25 DIAGNOSIS — Z720 Tobacco use: Secondary | ICD-10-CM | POA: Diagnosis not present

## 2018-01-25 DIAGNOSIS — F0280 Dementia in other diseases classified elsewhere without behavioral disturbance: Secondary | ICD-10-CM | POA: Diagnosis not present

## 2018-01-25 DIAGNOSIS — R350 Frequency of micturition: Secondary | ICD-10-CM | POA: Diagnosis not present

## 2018-01-25 DIAGNOSIS — N951 Menopausal and female climacteric states: Secondary | ICD-10-CM | POA: Diagnosis not present

## 2018-01-25 DIAGNOSIS — F411 Generalized anxiety disorder: Secondary | ICD-10-CM | POA: Diagnosis not present

## 2018-01-25 DIAGNOSIS — Z6822 Body mass index (BMI) 22.0-22.9, adult: Secondary | ICD-10-CM | POA: Diagnosis not present

## 2018-03-20 DIAGNOSIS — R918 Other nonspecific abnormal finding of lung field: Secondary | ICD-10-CM | POA: Diagnosis not present

## 2018-03-20 DIAGNOSIS — R079 Chest pain, unspecified: Secondary | ICD-10-CM | POA: Diagnosis not present

## 2018-03-20 DIAGNOSIS — R0602 Shortness of breath: Secondary | ICD-10-CM | POA: Diagnosis not present

## 2018-03-20 DIAGNOSIS — F17200 Nicotine dependence, unspecified, uncomplicated: Secondary | ICD-10-CM | POA: Diagnosis not present

## 2018-03-22 ENCOUNTER — Other Ambulatory Visit: Payer: Self-pay

## 2018-03-22 ENCOUNTER — Emergency Department (HOSPITAL_COMMUNITY): Payer: Medicare Other

## 2018-03-22 ENCOUNTER — Emergency Department (HOSPITAL_COMMUNITY)
Admission: EM | Admit: 2018-03-22 | Discharge: 2018-03-22 | Disposition: A | Discharge status: Home / Self Care | Payer: Medicare Other | Attending: Emergency Medicine | Admitting: Emergency Medicine

## 2018-03-22 ENCOUNTER — Encounter (HOSPITAL_COMMUNITY): Payer: Self-pay | Admitting: *Deleted

## 2018-03-22 DIAGNOSIS — R079 Chest pain, unspecified: Secondary | ICD-10-CM | POA: Diagnosis not present

## 2018-03-22 DIAGNOSIS — F17210 Nicotine dependence, cigarettes, uncomplicated: Secondary | ICD-10-CM | POA: Diagnosis not present

## 2018-03-22 DIAGNOSIS — R0600 Dyspnea, unspecified: Secondary | ICD-10-CM | POA: Insufficient documentation

## 2018-03-22 DIAGNOSIS — Z79899 Other long term (current) drug therapy: Secondary | ICD-10-CM | POA: Insufficient documentation

## 2018-03-22 DIAGNOSIS — R0602 Shortness of breath: Secondary | ICD-10-CM | POA: Diagnosis not present

## 2018-03-22 LAB — COMPREHENSIVE METABOLIC PANEL
ALT: 14 U/L (ref 0–44)
ANION GAP: 10 (ref 5–15)
AST: 40 U/L (ref 15–41)
Albumin: 4.2 g/dL (ref 3.5–5.0)
Alkaline Phosphatase: 84 U/L (ref 38–126)
BILIRUBIN TOTAL: 0.3 mg/dL (ref 0.3–1.2)
BUN: 10 mg/dL (ref 6–20)
CO2: 21 mmol/L — AB (ref 22–32)
Calcium: 10.3 mg/dL (ref 8.9–10.3)
Chloride: 104 mmol/L (ref 98–111)
Creatinine, Ser: 0.57 mg/dL (ref 0.44–1.00)
GFR calc Af Amer: >60 mL/min (ref >60)
GFR calc non Af Amer: >60 mL/min (ref >60)
Glucose, Bld: 114 mg/dL — ABNORMAL HIGH (ref 70–99)
POTASSIUM: 3.5 mmol/L (ref 3.5–5.1)
SODIUM: 135 mmol/L (ref 135–145)
Total Protein: 8.3 g/dL — ABNORMAL HIGH (ref 6.5–8.1)

## 2018-03-22 LAB — CBC WITH DIFFERENTIAL/PLATELET
Abs Immature Granulocytes: 0.03 10*3/uL (ref 0.00–0.07)
Basophils Absolute: 0.1 10*3/uL (ref 0.0–0.1)
Basophils Relative: 1 %
EOS PCT: 1 %
Eosinophils Absolute: 0.1 10*3/uL (ref 0.0–0.5)
HEMATOCRIT: 42 % (ref 36.0–46.0)
HEMOGLOBIN: 13.6 g/dL (ref 12.0–15.0)
IMMATURE GRANULOCYTES: 0 %
LYMPHS ABS: 3.9 10*3/uL (ref 0.7–4.0)
LYMPHS PCT: 36 %
MCH: 30.4 pg (ref 26.0–34.0)
MCHC: 32.4 g/dL (ref 30.0–36.0)
MCV: 93.8 fL (ref 80.0–100.0)
Monocytes Absolute: 0.8 10*3/uL (ref 0.1–1.0)
Monocytes Relative: 8 %
NEUTROS PCT: 54 %
Neutro Abs: 5.9 10*3/uL (ref 1.7–7.7)
Platelets: 399 10*3/uL (ref 150–400)
RBC: 4.48 MIL/uL (ref 3.87–5.11)
RDW: 14.4 % (ref 11.5–15.5)
WBC: 10.8 10*3/uL — AB (ref 4.0–10.5)
nRBC: 0 % (ref 0.0–0.2)

## 2018-03-22 MED ORDER — LACTATED RINGERS IV BOLUS: 1000.0000 mL | Freq: Once | INTRAVENOUS | Status: DC

## 2018-03-22 MED ORDER — KETOROLAC TROMETHAMINE 30 MG/ML IJ SOLN
15.0000 mg | Freq: Once | INTRAMUSCULAR | Status: DC
  Filled 2018-03-22: qty 1

## 2018-03-22 MED ORDER — LORAZEPAM 1 MG PO TABS
1.0000 mg | ORAL_TABLET | Freq: Once | ORAL | Status: AC
  Administered 2018-03-22: 1 mg via ORAL
  Filled 2018-03-22: qty 1

## 2018-03-22 MED ORDER — LORAZEPAM 2 MG/ML IJ SOLN
1.0000 mg | Freq: Once | INTRAMUSCULAR | Status: DC
  Filled 2018-03-22: qty 1

## 2018-03-22 MED ORDER — LORAZEPAM 1 MG PO TABS: 1.0000 mg | ORAL_TABLET | Freq: Three times a day (TID) | ORAL | 0 refills | Status: AC | PRN

## 2018-03-22 NOTE — ED Triage Notes (Signed)
C/o shortness of breath, states she was diagnosed with Lung Cancer 2 days ago, states she feels dehydrated, has an appointment for follow up on the 5th

## 2018-03-22 NOTE — ED Provider Notes (Signed)
[facility] EMERGENCY DEPARTMENT Provider Note   CSN: [id number] Arrival date & time: 03/22/18  1311     History   Chief Complaint Chief Complaint  Patient presents with  . Shortness of Breath    HPI Kristy Pearson is a 57 y.o. female.  HPI   57 year old female with shortness of breath.  She feels dehydrated has multiple other complaints.  She states that she has been eating sleeping.  This all began after she was diagnosed with likely lung cancer in outside facility few days ago.  Large right hilar mass was noted.  She has a scheduled oncology appointment tomorrow.  Past Medical History:  Diagnosis Date  . Anxiety   . Back pain   . Depression   . Migraine   . Urinary incontinence     There are no active problems to display for this patient.   Past Surgical History:  Procedure Laterality Date  . BACK SURGERY       OB History   None      Home Medications    Prior to Admission medications   Medication Sig Start Date End Date Taking? Authorizing Provider  amitriptyline (ELAVIL) 50 MG tablet Take 75 mg by mouth at bedtime.  07/20/16   [provider]  divalproex (DEPAKOTE ER) 250 MG 24 hr tablet Take 1 tablet (250 mg total) by mouth daily. **APPOINTMENT NEEDED FOR FURTHER REFILLS** 08/22/17   Melvenia Beam, MD  DULoxetine (CYMBALTA) 60 MG capsule Take 60 mg by mouth daily.  06/18/16   [provider]  HYDROcodone-acetaminophen (NORCO/VICODIN) 5-325 MG tablet Take 1 tablet by mouth every 6 (six) hours as needed.  08/18/16   [provider]    Family History Family History  Problem Relation Age of Onset  . Cancer Mother   . Heart attack Father   . Fibromyalgia Sister   . Lumbar disc disease Sister   . Fibromyalgia Sister   . Lumbar disc disease Sister     Social History Social History   Tobacco Use  . Smoking status: Current Every Day Smoker    Packs/day: 1.00  . Smokeless tobacco: Never Used  Substance Use Topics  . Alcohol  use: Yes    Comment: Occasional  . Drug use: No     Allergies   Ondansetron   Review of Systems Review of Systems  All systems reviewed and negative, other than as noted in HPI.  Physical Exam Updated Vital Signs BP 129/77 (BP Location: Right Arm)   Pulse (!) 110   Temp 97.6 F (36.4 C) (Oral)   Resp (!) 22   Ht 5\' 2"  (1.575 m)   Wt 56.2 kg   SpO2 94%   BMI 22.68 kg/m   Physical Exam  Constitutional: She appears well-developed and well-nourished. No distress.  HENT:  Head: Normocephalic and atraumatic.  Eyes: Conjunctivae are normal. Right eye exhibits no discharge. Left eye exhibits no discharge.  Neck: Neck supple.  Cardiovascular: Normal rate, regular rhythm and normal heart sounds. Exam reveals no gallop and no friction rub.  No murmur heard. Pulmonary/Chest: Effort normal and breath sounds normal. No respiratory distress.  Abdominal: Soft. She exhibits no distension. There is no tenderness.  Musculoskeletal: She exhibits no edema or tenderness.  Lower extremities symmetric as compared to each other. No calf tenderness. Negative Homan's. No palpable cords.   Neurological: She is alert.  Skin: Skin is warm and dry.  Psychiatric: She has a normal mood and affect. Her  behavior is normal. Thought content normal.  Nursing note and vitals reviewed.    ED Treatments / Results  Labs (all labs ordered are listed, but only abnormal results are displayed) Labs Reviewed  CBC WITH DIFFERENTIAL/PLATELET  COMPREHENSIVE METABOLIC PANEL  URINALYSIS, ROUTINE W REFLEX MICROSCOPIC    EKG None  Radiology Dg Chest 2 View  Result Date: 03/22/2018 CLINICAL DATA:  Initial evaluation for acute chest discomfort, shortness of breath. Recently diagnosed with lung cancer. EXAM: CHEST - 2 VIEW COMPARISON:  Prior CT from 03/20/2018. FINDINGS: Transverse heart size remains within normal limits. Approximate 7-8 cm suprahilar/right paratracheal mass again seen, stable. Trach air  column mildly bowed to the left but widely patent. Lungs well inflated. No focal infiltrates. No pulmonary edema or pleural effusion. No pneumothorax. Osseous structures within normal limits. IMPRESSION: 1. Unchanged right paratracheal/suprahilar mass. 2. No other active cardiopulmonary disease. Electronically Signed   By: Jeannine Boga M.D.   On: 03/22/2018 16:19    Procedures Procedures (including critical care time)  Medications Ordered in ED Medications - No data to display   Initial Impression / Assessment and Plan / ED Course  I have reviewed the triage vital signs and the nursing notes.  Pertinent labs & imaging results that were available during my care of the patient were reviewed by me and considered in my medical decision making (see chart for details).     57 year old female with several week history of dyspnea right-sided chest pain/shoulder pain.  She was just diagnosed with a right lung mass and is seeing oncology tomorrow.  She states that she has had anorexia, difficulty sleeping and generally not feeling well since this diagnosis.  I suspect that there is some understandable degree of anxiety on her part.  I doubt acute emergent condition.  Try to reassure her.  She is given a prescription for small amount of Ativan to take as needed.  Emergent return precautions were discussed.  Final Clinical Impressions(s) / ED Diagnoses   Final diagnoses:  Dyspnea, unspecified type    ED Discharge Orders    None       Virgel Manifold, MD 03/22/18 2241

## 2018-03-22 NOTE — Discharge Instructions (Signed)
Keep your appointment with oncology tomorrow.

## 2018-03-23 DIAGNOSIS — S069X9S Unspecified intracranial injury with loss of consciousness of unspecified duration, sequela: Secondary | ICD-10-CM | POA: Diagnosis not present

## 2018-03-23 DIAGNOSIS — Z5189 Encounter for other specified aftercare: Secondary | ICD-10-CM | POA: Diagnosis not present

## 2018-03-23 DIAGNOSIS — C3411 Malignant neoplasm of upper lobe, right bronchus or lung: Secondary | ICD-10-CM | POA: Diagnosis not present

## 2018-03-24 ENCOUNTER — Other Ambulatory Visit (HOSPITAL_COMMUNITY): Payer: Self-pay | Admitting: Oncology

## 2018-03-24 DIAGNOSIS — C3411 Malignant neoplasm of upper lobe, right bronchus or lung: Secondary | ICD-10-CM

## 2018-03-27 DIAGNOSIS — C7A1 Malignant poorly differentiated neuroendocrine tumors: Secondary | ICD-10-CM | POA: Diagnosis not present

## 2018-03-27 DIAGNOSIS — C3411 Malignant neoplasm of upper lobe, right bronchus or lung: Secondary | ICD-10-CM | POA: Diagnosis not present

## 2018-03-27 DIAGNOSIS — R9431 Abnormal electrocardiogram [ECG] [EKG]: Secondary | ICD-10-CM | POA: Diagnosis not present

## 2018-03-27 DIAGNOSIS — S069X9S Unspecified intracranial injury with loss of consciousness of unspecified duration, sequela: Secondary | ICD-10-CM | POA: Diagnosis not present

## 2018-03-27 DIAGNOSIS — R918 Other nonspecific abnormal finding of lung field: Secondary | ICD-10-CM | POA: Diagnosis not present

## 2018-03-27 DIAGNOSIS — Z5189 Encounter for other specified aftercare: Secondary | ICD-10-CM | POA: Diagnosis not present

## 2018-03-28 DIAGNOSIS — F411 Generalized anxiety disorder: Secondary | ICD-10-CM | POA: Diagnosis not present

## 2018-03-28 DIAGNOSIS — R51 Headache: Secondary | ICD-10-CM | POA: Diagnosis not present

## 2018-03-28 DIAGNOSIS — Z720 Tobacco use: Secondary | ICD-10-CM | POA: Diagnosis not present

## 2018-03-28 DIAGNOSIS — Z6821 Body mass index (BMI) 21.0-21.9, adult: Secondary | ICD-10-CM | POA: Diagnosis not present

## 2018-03-28 DIAGNOSIS — R918 Other nonspecific abnormal finding of lung field: Secondary | ICD-10-CM | POA: Diagnosis not present

## 2018-03-28 DIAGNOSIS — K210 Gastro-esophageal reflux disease with esophagitis: Secondary | ICD-10-CM | POA: Diagnosis not present

## 2018-03-28 DIAGNOSIS — F0280 Dementia in other diseases classified elsewhere without behavioral disturbance: Secondary | ICD-10-CM | POA: Diagnosis not present

## 2018-03-30 DIAGNOSIS — C3411 Malignant neoplasm of upper lobe, right bronchus or lung: Secondary | ICD-10-CM | POA: Diagnosis not present

## 2018-03-30 DIAGNOSIS — C3490 Malignant neoplasm of unspecified part of unspecified bronchus or lung: Secondary | ICD-10-CM | POA: Diagnosis not present

## 2018-04-04 ENCOUNTER — Ambulatory Visit (HOSPITAL_COMMUNITY)
Admission: RE | Admit: 2018-04-04 | Discharge: 2018-04-04 | Disposition: A | Discharge status: Home / Self Care | Payer: Medicare Other | Source: Ambulatory Visit | Attending: Oncology | Admitting: Oncology

## 2018-04-04 DIAGNOSIS — C3411 Malignant neoplasm of upper lobe, right bronchus or lung: Secondary | ICD-10-CM | POA: Diagnosis not present

## 2018-04-04 DIAGNOSIS — R918 Other nonspecific abnormal finding of lung field: Secondary | ICD-10-CM | POA: Diagnosis not present

## 2018-04-04 LAB — GLUCOSE, CAPILLARY: Glucose-Capillary: 122 mg/dL — ABNORMAL HIGH (ref 70–99)

## 2018-04-04 MED ORDER — FLUDEOXYGLUCOSE F - 18 (FDG) INJECTION
6.1200 | Freq: Once | INTRAVENOUS | Status: AC | PRN
  Administered 2018-04-04: 6.12 via INTRAVENOUS

## 2018-04-07 DIAGNOSIS — B370 Candidal stomatitis: Secondary | ICD-10-CM | POA: Diagnosis present

## 2018-04-07 DIAGNOSIS — F17210 Nicotine dependence, cigarettes, uncomplicated: Secondary | ICD-10-CM | POA: Diagnosis present

## 2018-04-07 DIAGNOSIS — R222 Localized swelling, mass and lump, trunk: Secondary | ICD-10-CM | POA: Diagnosis not present

## 2018-04-07 DIAGNOSIS — C7A1 Malignant poorly differentiated neuroendocrine tumors: Secondary | ICD-10-CM | POA: Diagnosis present

## 2018-04-07 DIAGNOSIS — J9601 Acute respiratory failure with hypoxia: Secondary | ICD-10-CM | POA: Diagnosis not present

## 2018-04-07 DIAGNOSIS — R918 Other nonspecific abnormal finding of lung field: Secondary | ICD-10-CM | POA: Diagnosis not present

## 2018-04-07 DIAGNOSIS — R0600 Dyspnea, unspecified: Secondary | ICD-10-CM | POA: Diagnosis not present

## 2018-04-07 DIAGNOSIS — C7951 Secondary malignant neoplasm of bone: Secondary | ICD-10-CM | POA: Diagnosis present

## 2018-04-07 DIAGNOSIS — C3481 Malignant neoplasm of overlapping sites of right bronchus and lung: Secondary | ICD-10-CM | POA: Diagnosis present

## 2018-04-07 DIAGNOSIS — C3491 Malignant neoplasm of unspecified part of right bronchus or lung: Secondary | ICD-10-CM | POA: Diagnosis not present

## 2018-04-07 DIAGNOSIS — E871 Hypo-osmolality and hyponatremia: Secondary | ICD-10-CM | POA: Diagnosis not present

## 2018-04-07 DIAGNOSIS — F329 Major depressive disorder, single episode, unspecified: Secondary | ICD-10-CM | POA: Diagnosis present

## 2018-04-07 DIAGNOSIS — R0602 Shortness of breath: Secondary | ICD-10-CM | POA: Diagnosis not present

## 2018-04-07 DIAGNOSIS — I871 Compression of vein: Secondary | ICD-10-CM | POA: Diagnosis present

## 2018-04-07 DIAGNOSIS — Z6821 Body mass index (BMI) 21.0-21.9, adult: Secondary | ICD-10-CM | POA: Diagnosis not present

## 2018-04-07 DIAGNOSIS — J449 Chronic obstructive pulmonary disease, unspecified: Secondary | ICD-10-CM | POA: Diagnosis present

## 2018-04-07 DIAGNOSIS — Z5111 Encounter for antineoplastic chemotherapy: Secondary | ICD-10-CM | POA: Diagnosis not present

## 2018-04-07 DIAGNOSIS — G8929 Other chronic pain: Secondary | ICD-10-CM | POA: Diagnosis present

## 2018-04-07 DIAGNOSIS — R0609 Other forms of dyspnea: Secondary | ICD-10-CM | POA: Diagnosis not present

## 2018-04-07 DIAGNOSIS — J90 Pleural effusion, not elsewhere classified: Secondary | ICD-10-CM | POA: Diagnosis not present

## 2018-04-07 DIAGNOSIS — M545 Low back pain: Secondary | ICD-10-CM | POA: Diagnosis present

## 2018-04-07 DIAGNOSIS — R0902 Hypoxemia: Secondary | ICD-10-CM | POA: Diagnosis not present

## 2018-04-07 DIAGNOSIS — S069X9S Unspecified intracranial injury with loss of consciousness of unspecified duration, sequela: Secondary | ICD-10-CM | POA: Diagnosis not present

## 2018-04-07 DIAGNOSIS — J9809 Other diseases of bronchus, not elsewhere classified: Secondary | ICD-10-CM | POA: Diagnosis present

## 2018-04-07 DIAGNOSIS — R9431 Abnormal electrocardiogram [ECG] [EKG]: Secondary | ICD-10-CM | POA: Diagnosis not present

## 2018-04-07 DIAGNOSIS — R5383 Other fatigue: Secondary | ICD-10-CM | POA: Diagnosis not present

## 2018-04-07 DIAGNOSIS — C3490 Malignant neoplasm of unspecified part of unspecified bronchus or lung: Secondary | ICD-10-CM | POA: Diagnosis not present

## 2018-04-07 DIAGNOSIS — J9811 Atelectasis: Secondary | ICD-10-CM | POA: Diagnosis present

## 2018-04-07 DIAGNOSIS — I361 Nonrheumatic tricuspid (valve) insufficiency: Secondary | ICD-10-CM | POA: Diagnosis not present

## 2018-04-07 DIAGNOSIS — J984 Other disorders of lung: Secondary | ICD-10-CM | POA: Diagnosis not present

## 2018-04-07 DIAGNOSIS — Z87820 Personal history of traumatic brain injury: Secondary | ICD-10-CM | POA: Diagnosis not present

## 2018-04-07 DIAGNOSIS — E440 Moderate protein-calorie malnutrition: Secondary | ICD-10-CM | POA: Diagnosis not present

## 2018-04-18 DIAGNOSIS — G8929 Other chronic pain: Secondary | ICD-10-CM | POA: Diagnosis not present

## 2018-04-18 DIAGNOSIS — C3481 Malignant neoplasm of overlapping sites of right bronchus and lung: Secondary | ICD-10-CM | POA: Diagnosis not present

## 2018-04-18 DIAGNOSIS — Z87898 Personal history of other specified conditions: Secondary | ICD-10-CM | POA: Diagnosis not present

## 2018-04-18 DIAGNOSIS — C3411 Malignant neoplasm of upper lobe, right bronchus or lung: Secondary | ICD-10-CM | POA: Diagnosis not present

## 2018-04-18 DIAGNOSIS — I871 Compression of vein: Secondary | ICD-10-CM | POA: Diagnosis not present

## 2018-04-18 DIAGNOSIS — C7951 Secondary malignant neoplasm of bone: Secondary | ICD-10-CM | POA: Diagnosis not present

## 2018-04-18 DIAGNOSIS — R630 Anorexia: Secondary | ICD-10-CM | POA: Diagnosis not present

## 2018-04-26 DIAGNOSIS — C3401 Malignant neoplasm of right main bronchus: Secondary | ICD-10-CM | POA: Diagnosis not present

## 2018-04-27 DIAGNOSIS — R630 Anorexia: Secondary | ICD-10-CM | POA: Diagnosis not present

## 2018-04-27 DIAGNOSIS — G8929 Other chronic pain: Secondary | ICD-10-CM | POA: Diagnosis not present

## 2018-04-27 DIAGNOSIS — C3490 Malignant neoplasm of unspecified part of unspecified bronchus or lung: Secondary | ICD-10-CM | POA: Diagnosis not present

## 2018-04-27 DIAGNOSIS — C3411 Malignant neoplasm of upper lobe, right bronchus or lung: Secondary | ICD-10-CM | POA: Diagnosis not present

## 2018-04-28 DIAGNOSIS — Z452 Encounter for adjustment and management of vascular access device: Secondary | ICD-10-CM | POA: Diagnosis not present

## 2018-04-28 DIAGNOSIS — Z87442 Personal history of urinary calculi: Secondary | ICD-10-CM | POA: Diagnosis not present

## 2018-04-28 DIAGNOSIS — R569 Unspecified convulsions: Secondary | ICD-10-CM | POA: Diagnosis not present

## 2018-04-28 DIAGNOSIS — Z9851 Tubal ligation status: Secondary | ICD-10-CM | POA: Diagnosis not present

## 2018-04-28 DIAGNOSIS — M1990 Unspecified osteoarthritis, unspecified site: Secondary | ICD-10-CM | POA: Diagnosis not present

## 2018-04-28 DIAGNOSIS — G629 Polyneuropathy, unspecified: Secondary | ICD-10-CM | POA: Diagnosis not present

## 2018-04-28 DIAGNOSIS — Z87820 Personal history of traumatic brain injury: Secondary | ICD-10-CM | POA: Diagnosis not present

## 2018-04-28 DIAGNOSIS — F329 Major depressive disorder, single episode, unspecified: Secondary | ICD-10-CM | POA: Diagnosis not present

## 2018-04-28 DIAGNOSIS — C3401 Malignant neoplasm of right main bronchus: Secondary | ICD-10-CM | POA: Diagnosis not present

## 2018-04-28 DIAGNOSIS — Z79899 Other long term (current) drug therapy: Secondary | ICD-10-CM | POA: Diagnosis not present

## 2018-04-28 DIAGNOSIS — Z888 Allergy status to other drugs, medicaments and biological substances status: Secondary | ICD-10-CM | POA: Diagnosis not present

## 2018-04-28 DIAGNOSIS — J984 Other disorders of lung: Secondary | ICD-10-CM | POA: Diagnosis not present

## 2018-04-28 DIAGNOSIS — C3490 Malignant neoplasm of unspecified part of unspecified bronchus or lung: Secondary | ICD-10-CM | POA: Diagnosis not present

## 2018-04-28 DIAGNOSIS — F17210 Nicotine dependence, cigarettes, uncomplicated: Secondary | ICD-10-CM | POA: Diagnosis not present

## 2018-04-28 DIAGNOSIS — G8929 Other chronic pain: Secondary | ICD-10-CM | POA: Diagnosis not present

## 2018-04-28 DIAGNOSIS — J449 Chronic obstructive pulmonary disease, unspecified: Secondary | ICD-10-CM | POA: Diagnosis not present

## 2018-05-01 DIAGNOSIS — R0902 Hypoxemia: Secondary | ICD-10-CM | POA: Diagnosis not present

## 2018-05-01 DIAGNOSIS — Z79899 Other long term (current) drug therapy: Secondary | ICD-10-CM | POA: Diagnosis not present

## 2018-05-01 DIAGNOSIS — J189 Pneumonia, unspecified organism: Secondary | ICD-10-CM | POA: Diagnosis not present

## 2018-05-01 DIAGNOSIS — R221 Localized swelling, mass and lump, neck: Secondary | ICD-10-CM | POA: Diagnosis not present

## 2018-05-01 DIAGNOSIS — J9691 Respiratory failure, unspecified with hypoxia: Secondary | ICD-10-CM | POA: Diagnosis not present

## 2018-05-01 DIAGNOSIS — I2699 Other pulmonary embolism without acute cor pulmonale: Secondary | ICD-10-CM | POA: Diagnosis not present

## 2018-05-01 DIAGNOSIS — C7951 Secondary malignant neoplasm of bone: Secondary | ICD-10-CM | POA: Diagnosis not present

## 2018-05-01 DIAGNOSIS — R918 Other nonspecific abnormal finding of lung field: Secondary | ICD-10-CM | POA: Diagnosis not present

## 2018-05-01 DIAGNOSIS — F17200 Nicotine dependence, unspecified, uncomplicated: Secondary | ICD-10-CM | POA: Diagnosis not present

## 2018-05-01 DIAGNOSIS — C3490 Malignant neoplasm of unspecified part of unspecified bronchus or lung: Secondary | ICD-10-CM | POA: Diagnosis not present

## 2018-05-01 DIAGNOSIS — C3491 Malignant neoplasm of unspecified part of right bronchus or lung: Secondary | ICD-10-CM | POA: Diagnosis not present

## 2018-05-01 DIAGNOSIS — I871 Compression of vein: Secondary | ICD-10-CM | POA: Diagnosis not present

## 2018-05-01 DIAGNOSIS — Z720 Tobacco use: Secondary | ICD-10-CM | POA: Diagnosis not present

## 2018-05-01 DIAGNOSIS — R0602 Shortness of breath: Secondary | ICD-10-CM | POA: Diagnosis not present

## 2018-05-02 DIAGNOSIS — F17210 Nicotine dependence, cigarettes, uncomplicated: Secondary | ICD-10-CM | POA: Diagnosis present

## 2018-05-02 DIAGNOSIS — J189 Pneumonia, unspecified organism: Secondary | ICD-10-CM | POA: Diagnosis not present

## 2018-05-02 DIAGNOSIS — Z801 Family history of malignant neoplasm of trachea, bronchus and lung: Secondary | ICD-10-CM | POA: Diagnosis not present

## 2018-05-02 DIAGNOSIS — J9809 Other diseases of bronchus, not elsewhere classified: Secondary | ICD-10-CM | POA: Diagnosis not present

## 2018-05-02 DIAGNOSIS — R0602 Shortness of breath: Secondary | ICD-10-CM | POA: Diagnosis not present

## 2018-05-02 DIAGNOSIS — R918 Other nonspecific abnormal finding of lung field: Secondary | ICD-10-CM | POA: Diagnosis not present

## 2018-05-02 DIAGNOSIS — S069X9S Unspecified intracranial injury with loss of consciousness of unspecified duration, sequela: Secondary | ICD-10-CM | POA: Diagnosis not present

## 2018-05-02 DIAGNOSIS — I871 Compression of vein: Secondary | ICD-10-CM | POA: Diagnosis present

## 2018-05-02 DIAGNOSIS — C7A1 Malignant poorly differentiated neuroendocrine tumors: Secondary | ICD-10-CM | POA: Diagnosis present

## 2018-05-02 DIAGNOSIS — I2699 Other pulmonary embolism without acute cor pulmonale: Secondary | ICD-10-CM | POA: Diagnosis not present

## 2018-05-02 DIAGNOSIS — R0902 Hypoxemia: Secondary | ICD-10-CM | POA: Diagnosis not present

## 2018-05-02 DIAGNOSIS — G40909 Epilepsy, unspecified, not intractable, without status epilepticus: Secondary | ICD-10-CM | POA: Diagnosis present

## 2018-05-02 DIAGNOSIS — G8929 Other chronic pain: Secondary | ICD-10-CM | POA: Diagnosis not present

## 2018-05-02 DIAGNOSIS — J449 Chronic obstructive pulmonary disease, unspecified: Secondary | ICD-10-CM | POA: Diagnosis present

## 2018-05-02 DIAGNOSIS — J9691 Respiratory failure, unspecified with hypoxia: Secondary | ICD-10-CM | POA: Diagnosis not present

## 2018-05-02 DIAGNOSIS — D649 Anemia, unspecified: Secondary | ICD-10-CM | POA: Diagnosis present

## 2018-05-02 DIAGNOSIS — C3490 Malignant neoplasm of unspecified part of unspecified bronchus or lung: Secondary | ICD-10-CM | POA: Diagnosis not present

## 2018-05-02 DIAGNOSIS — F329 Major depressive disorder, single episode, unspecified: Secondary | ICD-10-CM | POA: Diagnosis present

## 2018-05-02 DIAGNOSIS — R0600 Dyspnea, unspecified: Secondary | ICD-10-CM | POA: Diagnosis not present

## 2018-05-02 DIAGNOSIS — Z716 Tobacco abuse counseling: Secondary | ICD-10-CM | POA: Diagnosis not present

## 2018-05-02 DIAGNOSIS — C7951 Secondary malignant neoplasm of bone: Secondary | ICD-10-CM | POA: Diagnosis present

## 2018-05-09 DIAGNOSIS — C7951 Secondary malignant neoplasm of bone: Secondary | ICD-10-CM | POA: Diagnosis not present

## 2018-05-09 DIAGNOSIS — E871 Hypo-osmolality and hyponatremia: Secondary | ICD-10-CM | POA: Diagnosis not present

## 2018-05-09 DIAGNOSIS — D61810 Antineoplastic chemotherapy induced pancytopenia: Secondary | ICD-10-CM | POA: Diagnosis not present

## 2018-05-09 DIAGNOSIS — I871 Compression of vein: Secondary | ICD-10-CM | POA: Diagnosis not present

## 2018-05-09 DIAGNOSIS — B370 Candidal stomatitis: Secondary | ICD-10-CM | POA: Diagnosis not present

## 2018-05-09 DIAGNOSIS — G992 Myelopathy in diseases classified elsewhere: Secondary | ICD-10-CM | POA: Diagnosis not present

## 2018-05-09 DIAGNOSIS — M4854XA Collapsed vertebra, not elsewhere classified, thoracic region, initial encounter for fracture: Secondary | ICD-10-CM | POA: Diagnosis not present

## 2018-05-09 DIAGNOSIS — C3491 Malignant neoplasm of unspecified part of right bronchus or lung: Secondary | ICD-10-CM | POA: Diagnosis not present

## 2018-05-09 DIAGNOSIS — C412 Malignant neoplasm of vertebral column: Secondary | ICD-10-CM | POA: Diagnosis not present

## 2018-05-09 DIAGNOSIS — R0602 Shortness of breath: Secondary | ICD-10-CM | POA: Diagnosis not present

## 2018-05-11 DIAGNOSIS — C7951 Secondary malignant neoplasm of bone: Secondary | ICD-10-CM | POA: Diagnosis not present

## 2018-05-11 DIAGNOSIS — C3401 Malignant neoplasm of right main bronchus: Secondary | ICD-10-CM | POA: Diagnosis not present

## 2018-05-11 DIAGNOSIS — M4854XA Collapsed vertebra, not elsewhere classified, thoracic region, initial encounter for fracture: Secondary | ICD-10-CM | POA: Diagnosis not present

## 2018-05-11 DIAGNOSIS — I2699 Other pulmonary embolism without acute cor pulmonale: Secondary | ICD-10-CM | POA: Diagnosis not present

## 2018-05-11 DIAGNOSIS — G9520 Unspecified cord compression: Secondary | ICD-10-CM | POA: Diagnosis not present

## 2018-05-11 DIAGNOSIS — J9809 Other diseases of bronchus, not elsewhere classified: Secondary | ICD-10-CM | POA: Diagnosis not present

## 2018-05-11 DIAGNOSIS — M4804 Spinal stenosis, thoracic region: Secondary | ICD-10-CM | POA: Diagnosis not present

## 2018-05-11 DIAGNOSIS — R0602 Shortness of breath: Secondary | ICD-10-CM | POA: Diagnosis not present

## 2018-05-11 DIAGNOSIS — I82210 Acute embolism and thrombosis of superior vena cava: Secondary | ICD-10-CM | POA: Diagnosis not present

## 2018-05-11 DIAGNOSIS — I871 Compression of vein: Secondary | ICD-10-CM | POA: Diagnosis not present

## 2018-05-11 DIAGNOSIS — C3491 Malignant neoplasm of unspecified part of right bronchus or lung: Secondary | ICD-10-CM | POA: Diagnosis not present

## 2018-05-11 DIAGNOSIS — Z95828 Presence of other vascular implants and grafts: Secondary | ICD-10-CM | POA: Diagnosis not present

## 2018-05-11 DIAGNOSIS — M8448XA Pathological fracture, other site, initial encounter for fracture: Secondary | ICD-10-CM | POA: Diagnosis not present

## 2018-05-11 DIAGNOSIS — R918 Other nonspecific abnormal finding of lung field: Secondary | ICD-10-CM | POA: Diagnosis not present

## 2018-05-12 DIAGNOSIS — J9809 Other diseases of bronchus, not elsewhere classified: Secondary | ICD-10-CM | POA: Diagnosis present

## 2018-05-12 DIAGNOSIS — R0603 Acute respiratory distress: Secondary | ICD-10-CM | POA: Diagnosis not present

## 2018-05-12 DIAGNOSIS — C7951 Secondary malignant neoplasm of bone: Secondary | ICD-10-CM | POA: Diagnosis present

## 2018-05-12 DIAGNOSIS — Z4682 Encounter for fitting and adjustment of non-vascular catheter: Secondary | ICD-10-CM | POA: Diagnosis not present

## 2018-05-12 DIAGNOSIS — M4854XA Collapsed vertebra, not elsewhere classified, thoracic region, initial encounter for fracture: Secondary | ICD-10-CM | POA: Diagnosis not present

## 2018-05-12 DIAGNOSIS — I871 Compression of vein: Secondary | ICD-10-CM | POA: Diagnosis present

## 2018-05-12 DIAGNOSIS — C3411 Malignant neoplasm of upper lobe, right bronchus or lung: Secondary | ICD-10-CM | POA: Diagnosis not present

## 2018-05-12 DIAGNOSIS — E871 Hypo-osmolality and hyponatremia: Secondary | ICD-10-CM | POA: Diagnosis present

## 2018-05-12 DIAGNOSIS — R0600 Dyspnea, unspecified: Secondary | ICD-10-CM | POA: Diagnosis not present

## 2018-05-12 DIAGNOSIS — F17210 Nicotine dependence, cigarettes, uncomplicated: Secondary | ICD-10-CM | POA: Diagnosis present

## 2018-05-12 DIAGNOSIS — F419 Anxiety disorder, unspecified: Secondary | ICD-10-CM | POA: Diagnosis not present

## 2018-05-12 DIAGNOSIS — G479 Sleep disorder, unspecified: Secondary | ICD-10-CM | POA: Diagnosis not present

## 2018-05-12 DIAGNOSIS — D61818 Other pancytopenia: Secondary | ICD-10-CM | POA: Diagnosis not present

## 2018-05-12 DIAGNOSIS — Z66 Do not resuscitate: Secondary | ICD-10-CM | POA: Diagnosis present

## 2018-05-12 DIAGNOSIS — Z923 Personal history of irradiation: Secondary | ICD-10-CM | POA: Diagnosis not present

## 2018-05-12 DIAGNOSIS — R0609 Other forms of dyspnea: Secondary | ICD-10-CM | POA: Diagnosis not present

## 2018-05-12 DIAGNOSIS — C3401 Malignant neoplasm of right main bronchus: Secondary | ICD-10-CM | POA: Diagnosis not present

## 2018-05-12 DIAGNOSIS — R079 Chest pain, unspecified: Secondary | ICD-10-CM | POA: Diagnosis not present

## 2018-05-12 DIAGNOSIS — T451X5A Adverse effect of antineoplastic and immunosuppressive drugs, initial encounter: Secondary | ICD-10-CM | POA: Diagnosis not present

## 2018-05-12 DIAGNOSIS — D61810 Antineoplastic chemotherapy induced pancytopenia: Secondary | ICD-10-CM | POA: Diagnosis not present

## 2018-05-12 DIAGNOSIS — C412 Malignant neoplasm of vertebral column: Secondary | ICD-10-CM | POA: Diagnosis present

## 2018-05-12 DIAGNOSIS — K5900 Constipation, unspecified: Secondary | ICD-10-CM | POA: Diagnosis not present

## 2018-05-12 DIAGNOSIS — B370 Candidal stomatitis: Secondary | ICD-10-CM | POA: Diagnosis present

## 2018-05-12 DIAGNOSIS — Z7901 Long term (current) use of anticoagulants: Secondary | ICD-10-CM | POA: Diagnosis not present

## 2018-05-12 DIAGNOSIS — C7A8 Other malignant neuroendocrine tumors: Secondary | ICD-10-CM | POA: Diagnosis not present

## 2018-05-12 DIAGNOSIS — T66XXXA Radiation sickness, unspecified, initial encounter: Secondary | ICD-10-CM | POA: Diagnosis present

## 2018-05-12 DIAGNOSIS — G9520 Unspecified cord compression: Secondary | ICD-10-CM | POA: Diagnosis not present

## 2018-05-12 DIAGNOSIS — C3490 Malignant neoplasm of unspecified part of unspecified bronchus or lung: Secondary | ICD-10-CM | POA: Diagnosis present

## 2018-05-12 DIAGNOSIS — G8929 Other chronic pain: Secondary | ICD-10-CM | POA: Diagnosis present

## 2018-05-12 DIAGNOSIS — J449 Chronic obstructive pulmonary disease, unspecified: Secondary | ICD-10-CM | POA: Diagnosis present

## 2018-05-12 DIAGNOSIS — M8458XA Pathological fracture in neoplastic disease, other specified site, initial encounter for fracture: Secondary | ICD-10-CM | POA: Diagnosis present

## 2018-05-12 DIAGNOSIS — D473 Essential (hemorrhagic) thrombocythemia: Secondary | ICD-10-CM | POA: Diagnosis present

## 2018-05-12 DIAGNOSIS — M8448XA Pathological fracture, other site, initial encounter for fracture: Secondary | ICD-10-CM | POA: Diagnosis not present

## 2018-05-12 DIAGNOSIS — R52 Pain, unspecified: Secondary | ICD-10-CM | POA: Diagnosis not present

## 2018-05-12 DIAGNOSIS — C3491 Malignant neoplasm of unspecified part of right bronchus or lung: Secondary | ICD-10-CM | POA: Diagnosis not present

## 2018-05-12 DIAGNOSIS — Z515 Encounter for palliative care: Secondary | ICD-10-CM | POA: Diagnosis not present

## 2018-05-12 DIAGNOSIS — D649 Anemia, unspecified: Secondary | ICD-10-CM | POA: Diagnosis present

## 2018-05-12 DIAGNOSIS — M4804 Spinal stenosis, thoracic region: Secondary | ICD-10-CM | POA: Diagnosis present

## 2018-05-12 DIAGNOSIS — Z5111 Encounter for antineoplastic chemotherapy: Secondary | ICD-10-CM | POA: Diagnosis not present

## 2018-05-12 DIAGNOSIS — G992 Myelopathy in diseases classified elsewhere: Secondary | ICD-10-CM | POA: Diagnosis present

## 2018-05-12 DIAGNOSIS — G629 Polyneuropathy, unspecified: Secondary | ICD-10-CM | POA: Diagnosis present

## 2018-05-12 DIAGNOSIS — M4850XA Collapsed vertebra, not elsewhere classified, site unspecified, initial encounter for fracture: Secondary | ICD-10-CM | POA: Diagnosis not present

## 2018-05-12 DIAGNOSIS — K219 Gastro-esophageal reflux disease without esophagitis: Secondary | ICD-10-CM | POA: Diagnosis present

## 2018-05-12 DIAGNOSIS — G893 Neoplasm related pain (acute) (chronic): Secondary | ICD-10-CM | POA: Diagnosis not present

## 2018-05-16 DIAGNOSIS — C3411 Malignant neoplasm of upper lobe, right bronchus or lung: Secondary | ICD-10-CM | POA: Diagnosis not present

## 2018-05-16 DIAGNOSIS — C7951 Secondary malignant neoplasm of bone: Secondary | ICD-10-CM | POA: Diagnosis not present

## 2018-05-17 DIAGNOSIS — C3411 Malignant neoplasm of upper lobe, right bronchus or lung: Secondary | ICD-10-CM | POA: Diagnosis not present

## 2018-05-17 DIAGNOSIS — C7A8 Other malignant neuroendocrine tumors: Secondary | ICD-10-CM | POA: Diagnosis not present

## 2018-05-17 DIAGNOSIS — C7951 Secondary malignant neoplasm of bone: Secondary | ICD-10-CM | POA: Diagnosis not present

## 2018-05-24 DIAGNOSIS — C3401 Malignant neoplasm of right main bronchus: Secondary | ICD-10-CM | POA: Diagnosis not present

## 2018-05-24 DIAGNOSIS — C7951 Secondary malignant neoplasm of bone: Secondary | ICD-10-CM | POA: Diagnosis not present

## 2018-05-24 DIAGNOSIS — C3411 Malignant neoplasm of upper lobe, right bronchus or lung: Secondary | ICD-10-CM | POA: Diagnosis not present

## 2018-05-26 DIAGNOSIS — Z95828 Presence of other vascular implants and grafts: Secondary | ICD-10-CM | POA: Diagnosis not present

## 2018-05-26 DIAGNOSIS — Z08 Encounter for follow-up examination after completed treatment for malignant neoplasm: Secondary | ICD-10-CM | POA: Diagnosis not present

## 2018-05-26 DIAGNOSIS — J9809 Other diseases of bronchus, not elsewhere classified: Secondary | ICD-10-CM | POA: Diagnosis not present

## 2018-05-26 DIAGNOSIS — C3401 Malignant neoplasm of right main bronchus: Secondary | ICD-10-CM | POA: Diagnosis not present

## 2018-05-26 DIAGNOSIS — C7951 Secondary malignant neoplasm of bone: Secondary | ICD-10-CM | POA: Diagnosis not present

## 2018-05-26 DIAGNOSIS — G9520 Unspecified cord compression: Secondary | ICD-10-CM | POA: Diagnosis not present

## 2018-05-26 DIAGNOSIS — R0602 Shortness of breath: Secondary | ICD-10-CM | POA: Diagnosis not present

## 2018-05-26 DIAGNOSIS — S069X9S Unspecified intracranial injury with loss of consciousness of unspecified duration, sequela: Secondary | ICD-10-CM | POA: Diagnosis not present

## 2018-05-26 DIAGNOSIS — I871 Compression of vein: Secondary | ICD-10-CM | POA: Diagnosis not present

## 2018-05-30 DIAGNOSIS — C3401 Malignant neoplasm of right main bronchus: Secondary | ICD-10-CM | POA: Diagnosis not present

## 2018-05-30 DIAGNOSIS — G9520 Unspecified cord compression: Secondary | ICD-10-CM | POA: Diagnosis not present

## 2018-05-30 DIAGNOSIS — S069X9S Unspecified intracranial injury with loss of consciousness of unspecified duration, sequela: Secondary | ICD-10-CM | POA: Diagnosis not present

## 2018-05-30 DIAGNOSIS — C7951 Secondary malignant neoplasm of bone: Secondary | ICD-10-CM | POA: Diagnosis not present

## 2018-06-06 DIAGNOSIS — G4089 Other seizures: Secondary | ICD-10-CM | POA: Diagnosis not present

## 2018-06-06 DIAGNOSIS — M1990 Unspecified osteoarthritis, unspecified site: Secondary | ICD-10-CM | POA: Diagnosis not present

## 2018-06-06 DIAGNOSIS — R0602 Shortness of breath: Secondary | ICD-10-CM | POA: Diagnosis not present

## 2018-06-06 DIAGNOSIS — C3490 Malignant neoplasm of unspecified part of unspecified bronchus or lung: Secondary | ICD-10-CM | POA: Diagnosis not present

## 2018-06-06 DIAGNOSIS — R531 Weakness: Secondary | ICD-10-CM | POA: Diagnosis not present

## 2018-06-06 DIAGNOSIS — R630 Anorexia: Secondary | ICD-10-CM | POA: Diagnosis not present

## 2018-06-06 DIAGNOSIS — R231 Pallor: Secondary | ICD-10-CM | POA: Diagnosis not present

## 2018-06-06 DIAGNOSIS — R52 Pain, unspecified: Secondary | ICD-10-CM | POA: Diagnosis not present

## 2018-06-07 DIAGNOSIS — R531 Weakness: Secondary | ICD-10-CM | POA: Diagnosis not present

## 2018-06-07 DIAGNOSIS — C3490 Malignant neoplasm of unspecified part of unspecified bronchus or lung: Secondary | ICD-10-CM | POA: Diagnosis not present

## 2018-06-07 DIAGNOSIS — G4089 Other seizures: Secondary | ICD-10-CM | POA: Diagnosis not present

## 2018-06-07 DIAGNOSIS — R231 Pallor: Secondary | ICD-10-CM | POA: Diagnosis not present

## 2018-06-07 DIAGNOSIS — R630 Anorexia: Secondary | ICD-10-CM | POA: Diagnosis not present

## 2018-06-07 DIAGNOSIS — M1990 Unspecified osteoarthritis, unspecified site: Secondary | ICD-10-CM | POA: Diagnosis not present

## 2018-06-09 DIAGNOSIS — G4089 Other seizures: Secondary | ICD-10-CM | POA: Diagnosis not present

## 2018-06-09 DIAGNOSIS — R231 Pallor: Secondary | ICD-10-CM | POA: Diagnosis not present

## 2018-06-09 DIAGNOSIS — R531 Weakness: Secondary | ICD-10-CM | POA: Diagnosis not present

## 2018-06-09 DIAGNOSIS — M1990 Unspecified osteoarthritis, unspecified site: Secondary | ICD-10-CM | POA: Diagnosis not present

## 2018-06-09 DIAGNOSIS — R630 Anorexia: Secondary | ICD-10-CM | POA: Diagnosis not present

## 2018-06-09 DIAGNOSIS — C3490 Malignant neoplasm of unspecified part of unspecified bronchus or lung: Secondary | ICD-10-CM | POA: Diagnosis not present

## 2018-06-14 DIAGNOSIS — R231 Pallor: Secondary | ICD-10-CM | POA: Diagnosis not present

## 2018-06-14 DIAGNOSIS — G4089 Other seizures: Secondary | ICD-10-CM | POA: Diagnosis not present

## 2018-06-14 DIAGNOSIS — C3490 Malignant neoplasm of unspecified part of unspecified bronchus or lung: Secondary | ICD-10-CM | POA: Diagnosis not present

## 2018-06-14 DIAGNOSIS — M1990 Unspecified osteoarthritis, unspecified site: Secondary | ICD-10-CM | POA: Diagnosis not present

## 2018-06-14 DIAGNOSIS — R630 Anorexia: Secondary | ICD-10-CM | POA: Diagnosis not present

## 2018-06-14 DIAGNOSIS — R531 Weakness: Secondary | ICD-10-CM | POA: Diagnosis not present

## 2018-06-18 DIAGNOSIS — G4089 Other seizures: Secondary | ICD-10-CM | POA: Diagnosis not present

## 2018-06-18 DIAGNOSIS — R630 Anorexia: Secondary | ICD-10-CM | POA: Diagnosis not present

## 2018-06-18 DIAGNOSIS — M1990 Unspecified osteoarthritis, unspecified site: Secondary | ICD-10-CM | POA: Diagnosis not present

## 2018-06-18 DIAGNOSIS — R0602 Shortness of breath: Secondary | ICD-10-CM | POA: Diagnosis not present

## 2018-06-18 DIAGNOSIS — R531 Weakness: Secondary | ICD-10-CM | POA: Diagnosis not present

## 2018-06-18 DIAGNOSIS — R52 Pain, unspecified: Secondary | ICD-10-CM | POA: Diagnosis not present

## 2018-06-18 DIAGNOSIS — C3490 Malignant neoplasm of unspecified part of unspecified bronchus or lung: Secondary | ICD-10-CM | POA: Diagnosis not present

## 2018-06-18 DIAGNOSIS — R231 Pallor: Secondary | ICD-10-CM | POA: Diagnosis not present

## 2018-06-21 DIAGNOSIS — C3490 Malignant neoplasm of unspecified part of unspecified bronchus or lung: Secondary | ICD-10-CM | POA: Diagnosis not present

## 2018-06-21 DIAGNOSIS — M1990 Unspecified osteoarthritis, unspecified site: Secondary | ICD-10-CM | POA: Diagnosis not present

## 2018-06-21 DIAGNOSIS — R630 Anorexia: Secondary | ICD-10-CM | POA: Diagnosis not present

## 2018-06-21 DIAGNOSIS — R531 Weakness: Secondary | ICD-10-CM | POA: Diagnosis not present

## 2018-06-21 DIAGNOSIS — G4089 Other seizures: Secondary | ICD-10-CM | POA: Diagnosis not present

## 2018-06-21 DIAGNOSIS — R231 Pallor: Secondary | ICD-10-CM | POA: Diagnosis not present

## 2018-06-26 DIAGNOSIS — R531 Weakness: Secondary | ICD-10-CM | POA: Diagnosis not present

## 2018-06-26 DIAGNOSIS — G4089 Other seizures: Secondary | ICD-10-CM | POA: Diagnosis not present

## 2018-06-26 DIAGNOSIS — C3490 Malignant neoplasm of unspecified part of unspecified bronchus or lung: Secondary | ICD-10-CM | POA: Diagnosis not present

## 2018-06-26 DIAGNOSIS — M1990 Unspecified osteoarthritis, unspecified site: Secondary | ICD-10-CM | POA: Diagnosis not present

## 2018-06-26 DIAGNOSIS — R231 Pallor: Secondary | ICD-10-CM | POA: Diagnosis not present

## 2018-06-26 DIAGNOSIS — R630 Anorexia: Secondary | ICD-10-CM | POA: Diagnosis not present

## 2018-06-29 DIAGNOSIS — M1990 Unspecified osteoarthritis, unspecified site: Secondary | ICD-10-CM | POA: Diagnosis not present

## 2018-06-29 DIAGNOSIS — C3490 Malignant neoplasm of unspecified part of unspecified bronchus or lung: Secondary | ICD-10-CM | POA: Diagnosis not present

## 2018-06-29 DIAGNOSIS — R630 Anorexia: Secondary | ICD-10-CM | POA: Diagnosis not present

## 2018-06-29 DIAGNOSIS — R531 Weakness: Secondary | ICD-10-CM | POA: Diagnosis not present

## 2018-06-29 DIAGNOSIS — R231 Pallor: Secondary | ICD-10-CM | POA: Diagnosis not present

## 2018-06-29 DIAGNOSIS — G4089 Other seizures: Secondary | ICD-10-CM | POA: Diagnosis not present

## 2018-07-03 DIAGNOSIS — R630 Anorexia: Secondary | ICD-10-CM | POA: Diagnosis not present

## 2018-07-03 DIAGNOSIS — R231 Pallor: Secondary | ICD-10-CM | POA: Diagnosis not present

## 2018-07-03 DIAGNOSIS — C3490 Malignant neoplasm of unspecified part of unspecified bronchus or lung: Secondary | ICD-10-CM | POA: Diagnosis not present

## 2018-07-03 DIAGNOSIS — G4089 Other seizures: Secondary | ICD-10-CM | POA: Diagnosis not present

## 2018-07-03 DIAGNOSIS — R531 Weakness: Secondary | ICD-10-CM | POA: Diagnosis not present

## 2018-07-03 DIAGNOSIS — M1990 Unspecified osteoarthritis, unspecified site: Secondary | ICD-10-CM | POA: Diagnosis not present

## 2018-07-05 DIAGNOSIS — G4089 Other seizures: Secondary | ICD-10-CM | POA: Diagnosis not present

## 2018-07-05 DIAGNOSIS — C3490 Malignant neoplasm of unspecified part of unspecified bronchus or lung: Secondary | ICD-10-CM | POA: Diagnosis not present

## 2018-07-05 DIAGNOSIS — R531 Weakness: Secondary | ICD-10-CM | POA: Diagnosis not present

## 2018-07-05 DIAGNOSIS — R231 Pallor: Secondary | ICD-10-CM | POA: Diagnosis not present

## 2018-07-05 DIAGNOSIS — R630 Anorexia: Secondary | ICD-10-CM | POA: Diagnosis not present

## 2018-07-05 DIAGNOSIS — M1990 Unspecified osteoarthritis, unspecified site: Secondary | ICD-10-CM | POA: Diagnosis not present

## 2018-07-06 DIAGNOSIS — R630 Anorexia: Secondary | ICD-10-CM | POA: Diagnosis not present

## 2018-07-06 DIAGNOSIS — M1990 Unspecified osteoarthritis, unspecified site: Secondary | ICD-10-CM | POA: Diagnosis not present

## 2018-07-06 DIAGNOSIS — R231 Pallor: Secondary | ICD-10-CM | POA: Diagnosis not present

## 2018-07-06 DIAGNOSIS — R531 Weakness: Secondary | ICD-10-CM | POA: Diagnosis not present

## 2018-07-06 DIAGNOSIS — G4089 Other seizures: Secondary | ICD-10-CM | POA: Diagnosis not present

## 2018-07-06 DIAGNOSIS — C3490 Malignant neoplasm of unspecified part of unspecified bronchus or lung: Secondary | ICD-10-CM | POA: Diagnosis not present

## 2018-07-07 DIAGNOSIS — G4089 Other seizures: Secondary | ICD-10-CM | POA: Diagnosis not present

## 2018-07-07 DIAGNOSIS — C3490 Malignant neoplasm of unspecified part of unspecified bronchus or lung: Secondary | ICD-10-CM | POA: Diagnosis not present

## 2018-07-07 DIAGNOSIS — R231 Pallor: Secondary | ICD-10-CM | POA: Diagnosis not present

## 2018-07-07 DIAGNOSIS — R531 Weakness: Secondary | ICD-10-CM | POA: Diagnosis not present

## 2018-07-07 DIAGNOSIS — R630 Anorexia: Secondary | ICD-10-CM | POA: Diagnosis not present

## 2018-07-07 DIAGNOSIS — M1990 Unspecified osteoarthritis, unspecified site: Secondary | ICD-10-CM | POA: Diagnosis not present

## 2018-07-10 DIAGNOSIS — R531 Weakness: Secondary | ICD-10-CM | POA: Diagnosis not present

## 2018-07-10 DIAGNOSIS — M1990 Unspecified osteoarthritis, unspecified site: Secondary | ICD-10-CM | POA: Diagnosis not present

## 2018-07-10 DIAGNOSIS — G4089 Other seizures: Secondary | ICD-10-CM | POA: Diagnosis not present

## 2018-07-10 DIAGNOSIS — C3490 Malignant neoplasm of unspecified part of unspecified bronchus or lung: Secondary | ICD-10-CM | POA: Diagnosis not present

## 2018-07-10 DIAGNOSIS — R231 Pallor: Secondary | ICD-10-CM | POA: Diagnosis not present

## 2018-07-10 DIAGNOSIS — R630 Anorexia: Secondary | ICD-10-CM | POA: Diagnosis not present

## 2018-07-13 DIAGNOSIS — R531 Weakness: Secondary | ICD-10-CM | POA: Diagnosis not present

## 2018-07-13 DIAGNOSIS — R231 Pallor: Secondary | ICD-10-CM | POA: Diagnosis not present

## 2018-07-13 DIAGNOSIS — M1990 Unspecified osteoarthritis, unspecified site: Secondary | ICD-10-CM | POA: Diagnosis not present

## 2018-07-13 DIAGNOSIS — C3490 Malignant neoplasm of unspecified part of unspecified bronchus or lung: Secondary | ICD-10-CM | POA: Diagnosis not present

## 2018-07-13 DIAGNOSIS — R630 Anorexia: Secondary | ICD-10-CM | POA: Diagnosis not present

## 2018-07-13 DIAGNOSIS — G4089 Other seizures: Secondary | ICD-10-CM | POA: Diagnosis not present

## 2018-07-17 DIAGNOSIS — R531 Weakness: Secondary | ICD-10-CM | POA: Diagnosis not present

## 2018-07-17 DIAGNOSIS — R231 Pallor: Secondary | ICD-10-CM | POA: Diagnosis not present

## 2018-07-17 DIAGNOSIS — R630 Anorexia: Secondary | ICD-10-CM | POA: Diagnosis not present

## 2018-07-17 DIAGNOSIS — M1990 Unspecified osteoarthritis, unspecified site: Secondary | ICD-10-CM | POA: Diagnosis not present

## 2018-07-17 DIAGNOSIS — C3490 Malignant neoplasm of unspecified part of unspecified bronchus or lung: Secondary | ICD-10-CM | POA: Diagnosis not present

## 2018-07-17 DIAGNOSIS — G4089 Other seizures: Secondary | ICD-10-CM | POA: Diagnosis not present

## 2018-07-19 DIAGNOSIS — C3490 Malignant neoplasm of unspecified part of unspecified bronchus or lung: Secondary | ICD-10-CM | POA: Diagnosis not present

## 2018-07-19 DIAGNOSIS — R531 Weakness: Secondary | ICD-10-CM | POA: Diagnosis not present

## 2018-07-19 DIAGNOSIS — R0602 Shortness of breath: Secondary | ICD-10-CM | POA: Diagnosis not present

## 2018-07-19 DIAGNOSIS — G4089 Other seizures: Secondary | ICD-10-CM | POA: Diagnosis not present

## 2018-07-19 DIAGNOSIS — R630 Anorexia: Secondary | ICD-10-CM | POA: Diagnosis not present

## 2018-07-19 DIAGNOSIS — R52 Pain, unspecified: Secondary | ICD-10-CM | POA: Diagnosis not present

## 2018-07-19 DIAGNOSIS — R231 Pallor: Secondary | ICD-10-CM | POA: Diagnosis not present

## 2018-07-19 DIAGNOSIS — M1990 Unspecified osteoarthritis, unspecified site: Secondary | ICD-10-CM | POA: Diagnosis not present

## 2018-07-22 DIAGNOSIS — G4089 Other seizures: Secondary | ICD-10-CM | POA: Diagnosis not present

## 2018-07-22 DIAGNOSIS — R531 Weakness: Secondary | ICD-10-CM | POA: Diagnosis not present

## 2018-07-22 DIAGNOSIS — R630 Anorexia: Secondary | ICD-10-CM | POA: Diagnosis not present

## 2018-07-22 DIAGNOSIS — C3490 Malignant neoplasm of unspecified part of unspecified bronchus or lung: Secondary | ICD-10-CM | POA: Diagnosis not present

## 2018-07-22 DIAGNOSIS — M1990 Unspecified osteoarthritis, unspecified site: Secondary | ICD-10-CM | POA: Diagnosis not present

## 2018-07-22 DIAGNOSIS — R231 Pallor: Secondary | ICD-10-CM | POA: Diagnosis not present

## 2018-08-18 DEATH — deceased

## 2020-08-28 IMAGING — DX DG CHEST 2V
2 series · 2 of 2 positions shown · non-contrast
Comparison: Prior CT from 03/20/2018.

CLINICAL DATA: Initial evaluation for acute chest discomfort,
shortness of breath. Recently diagnosed with lung cancer.

EXAM:
CHEST - 2 VIEW

[chest pa]
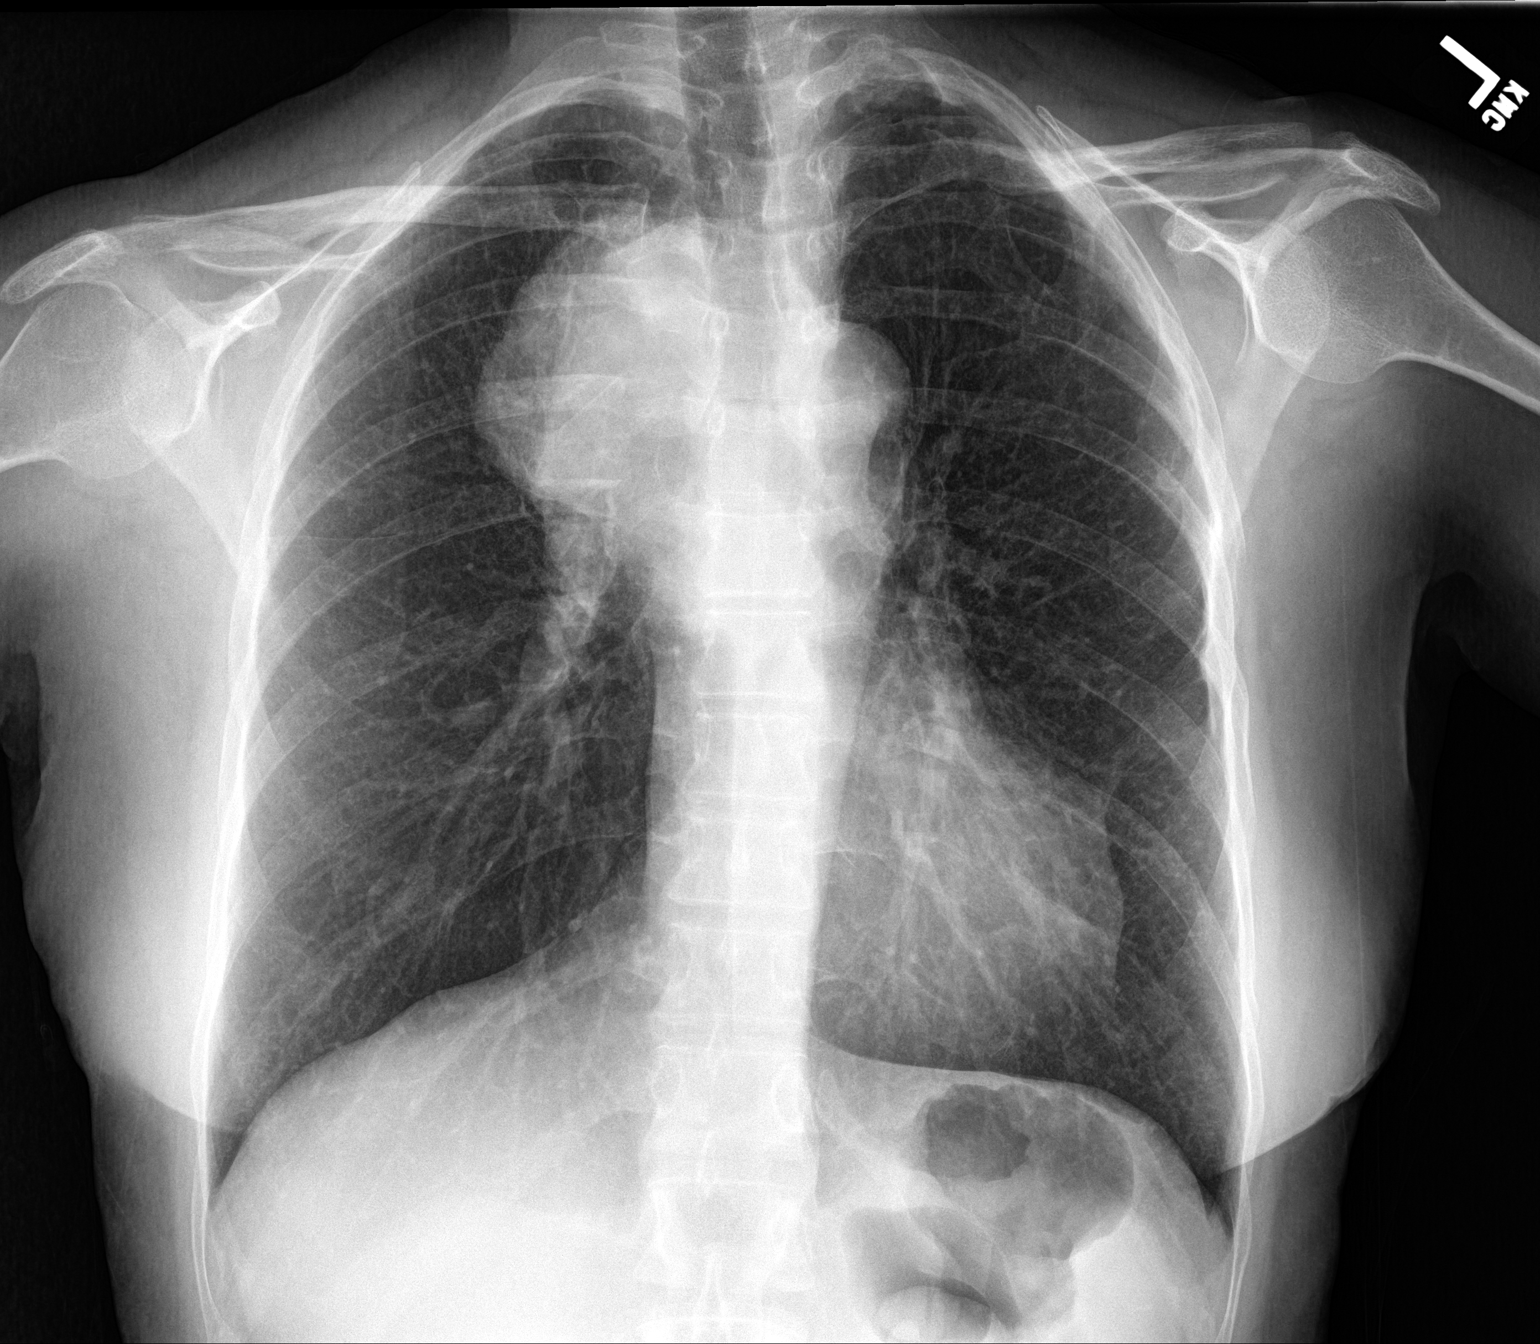

[chest lat]
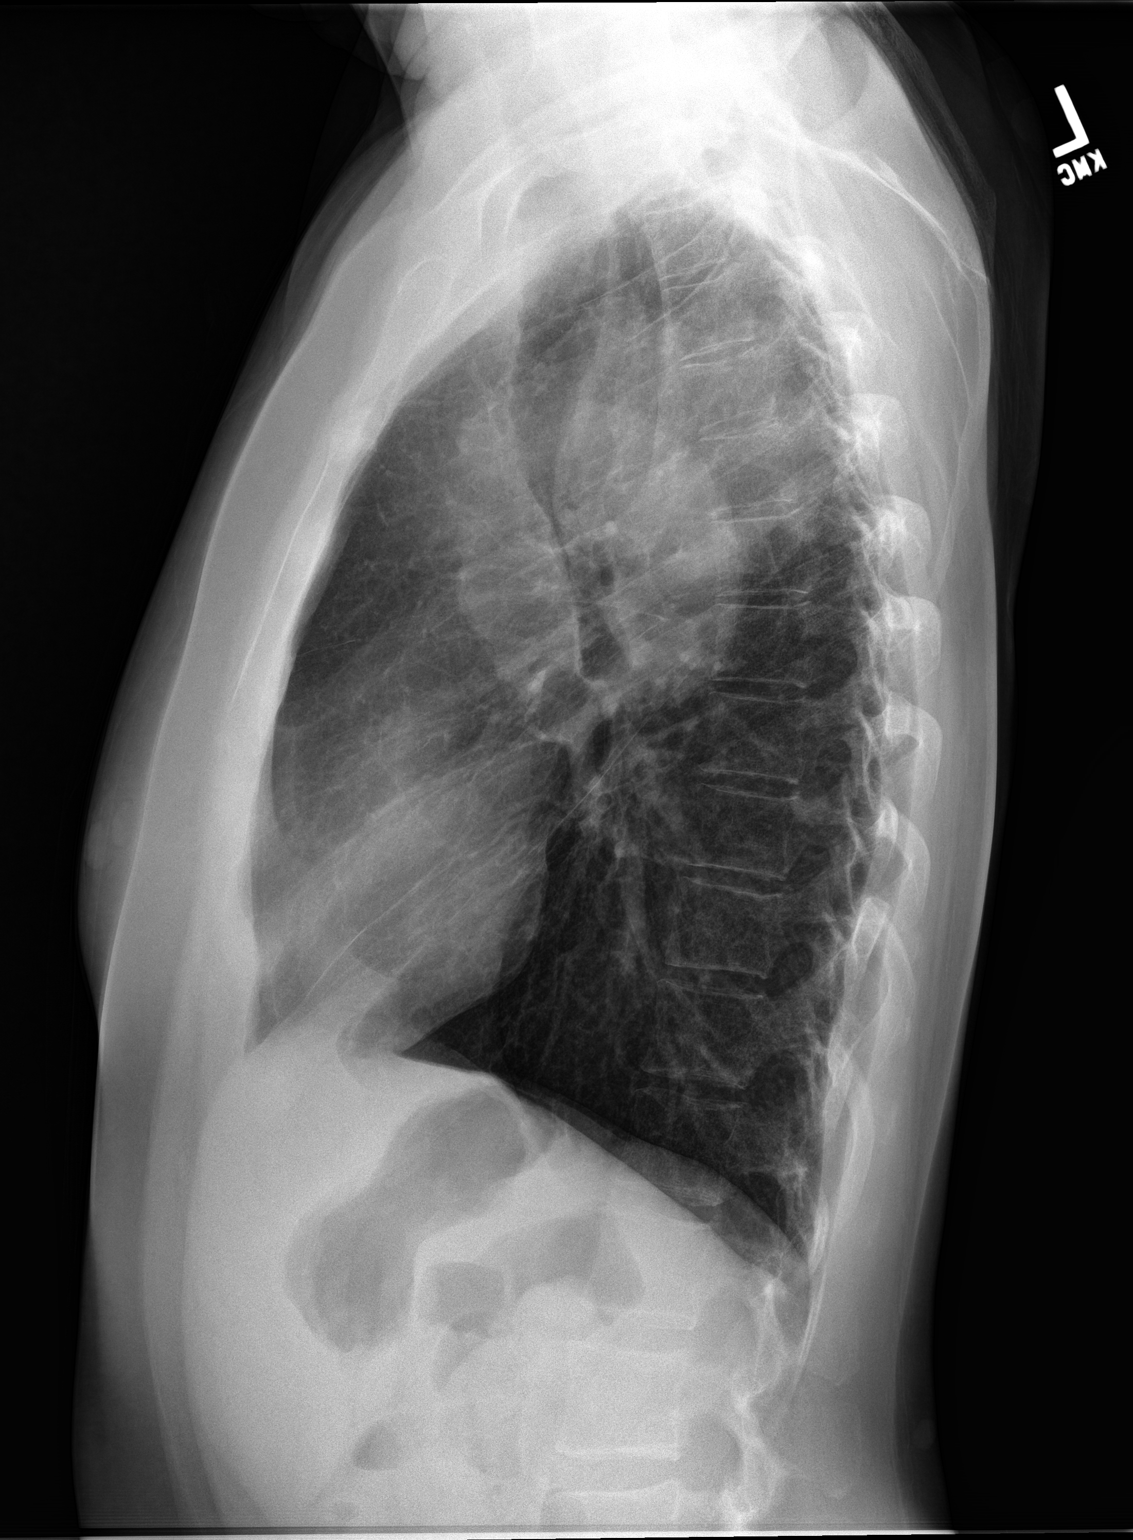

[2 of 2 positions shown; findings below may reference images not displayed]

FINDINGS: Transverse heart size remains within normal limits. Approximate 7-8
cm suprahilar/right paratracheal mass again seen, stable. Trach air
column mildly bowed to the left but widely patent.

Lungs well inflated. No focal infiltrates. No pulmonary edema or
pleural effusion. No pneumothorax.

Osseous structures within normal limits.
IMPRESSION: 1. Unchanged right paratracheal/suprahilar mass.
2. No other active cardiopulmonary disease.
